# Patient Record
Sex: Female | Born: 1950 | Race: White | Hispanic: No | Marital: Single | Smoking: Former smoker
Health system: Southern US, Community
[De-identification: ages and names within clinical notes are randomized; demographics above are authoritative.]

## PROBLEM LIST (undated history)

## (undated) DIAGNOSIS — E785 Hyperlipidemia, unspecified: Secondary | ICD-10-CM

## (undated) DIAGNOSIS — I10 Essential (primary) hypertension: Secondary | ICD-10-CM

## (undated) HISTORY — PX: OTHER SURGICAL HISTORY: SHX169

## (undated) HISTORY — DX: Hyperlipidemia, unspecified: E78.5

## (undated) HISTORY — PX: APPENDECTOMY: SHX54

## (undated) HISTORY — PX: FINGER SURGERY: SHX640

## (undated) HISTORY — DX: Essential (primary) hypertension: I10

## (undated) HISTORY — PX: CHOLECYSTECTOMY: SHX55

---

## 2015-11-06 ENCOUNTER — Other Ambulatory Visit: Payer: Self-pay | Admitting: General Surgery

## 2015-11-06 DIAGNOSIS — R109 Unspecified abdominal pain: Secondary | ICD-10-CM

## 2015-11-12 ENCOUNTER — Ambulatory Visit
Admission: RE | Admit: 2015-11-12 | Discharge: 2015-11-12 | Disposition: A | Discharge status: Home / Self Care | Payer: Federal, State, Local not specified - PPO | Source: Ambulatory Visit | Attending: General Surgery | Admitting: General Surgery

## 2015-11-12 DIAGNOSIS — R109 Unspecified abdominal pain: Secondary | ICD-10-CM

## 2015-11-12 MED ORDER — IOPAMIDOL (ISOVUE-300) INJECTION 61%
100.0000 mL | Freq: Once | INTRAVENOUS | Status: AC | PRN
  Administered 2015-11-12: 100 mL via INTRAVENOUS

## 2017-06-16 IMAGING — CT CT ABD-PELV W/ CM
3 of 5 series · 12 of 36 positions shown, 18 images · IV contrast (READICAT/WATER & [ID] ISOVUE 300)
Comparison: None.

CLINICAL DATA: Right lower quadrant pain for 6 months.
Constipation. Previous appendectomy, cholecystectomy, tubal ligation
and C-section.

EXAM:
CT ABDOMEN AND PELVIS WITH CONTRAST
TECHNIQUE: Multidetector CT imaging of the abdomen and pelvis was performed
using the standard protocol following bolus administration of
intravenous contrast.
CONTRAST:  100mL ST2U4S-4CC IOPAMIDOL (ST2U4S-4CC) INJECTION 61%

[Series 3: abd/pelvis with · axial · 0.74mm/px · z∈[-303,-8]mm · 7 of 79 slices shown, 12 images]
[im 10/79  soft-tissue]
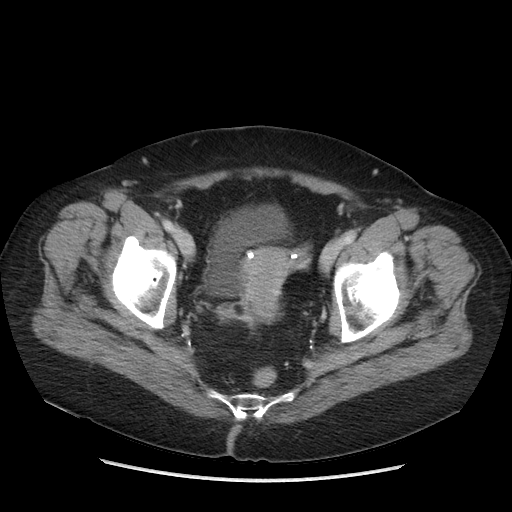
[im 10/79  bone]
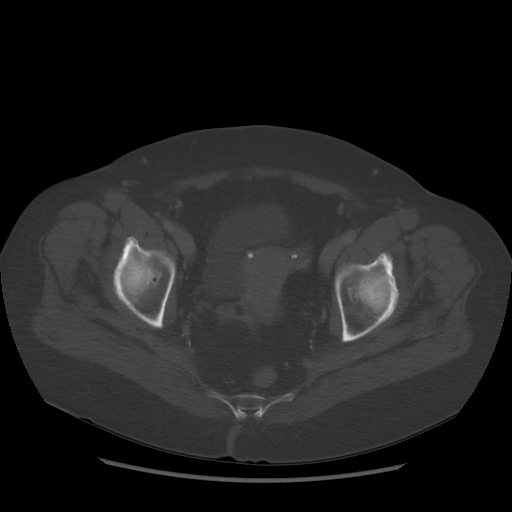
[im 20/79  soft-tissue]
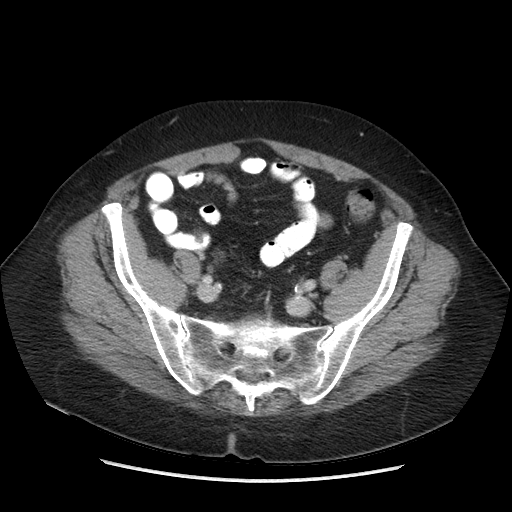
[im 30/79  soft-tissue]
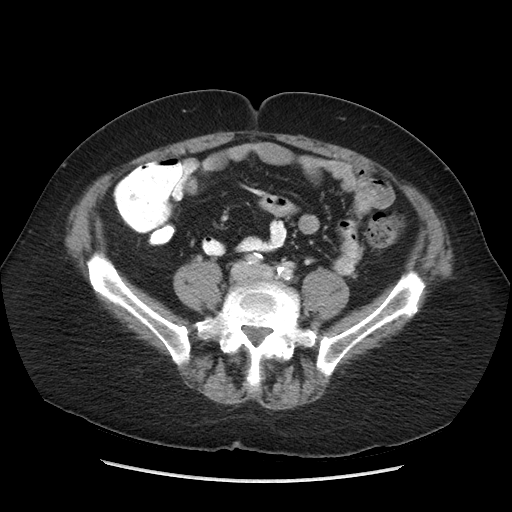
[im 40/79  soft-tissue]
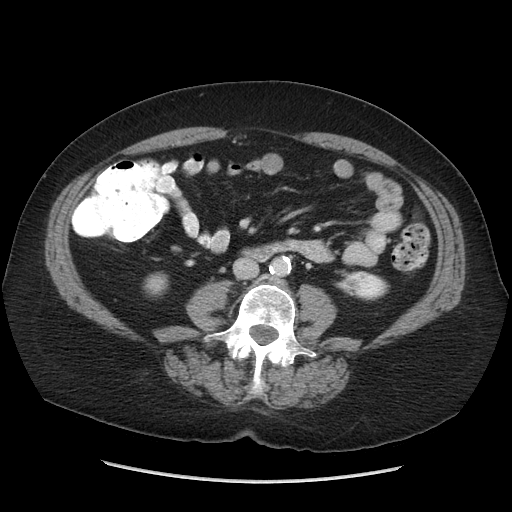
[im 40/79  lung]
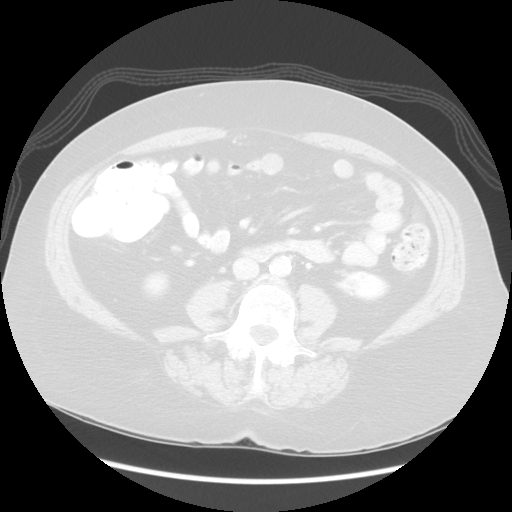
[im 49/79  soft-tissue]
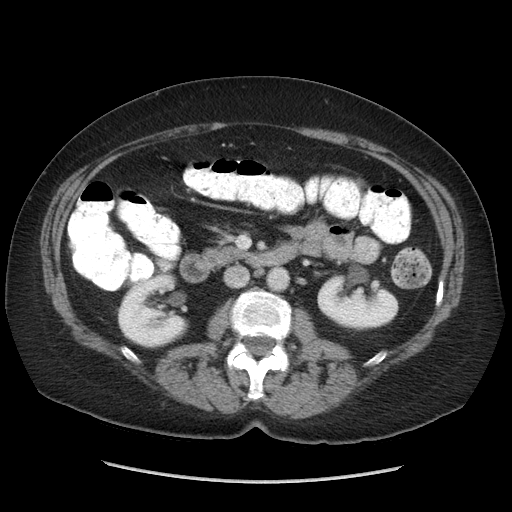
[im 49/79  lung]
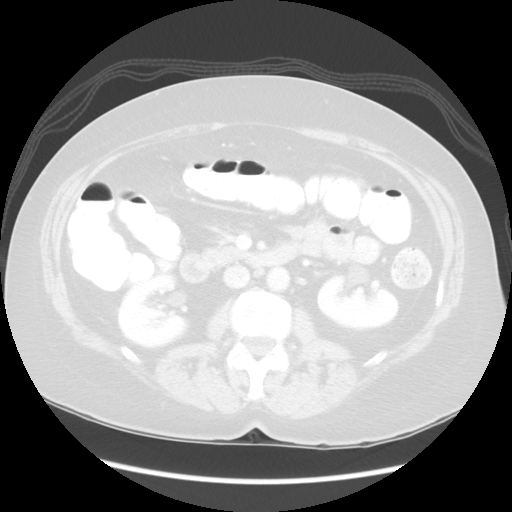
[im 59/79  soft-tissue]
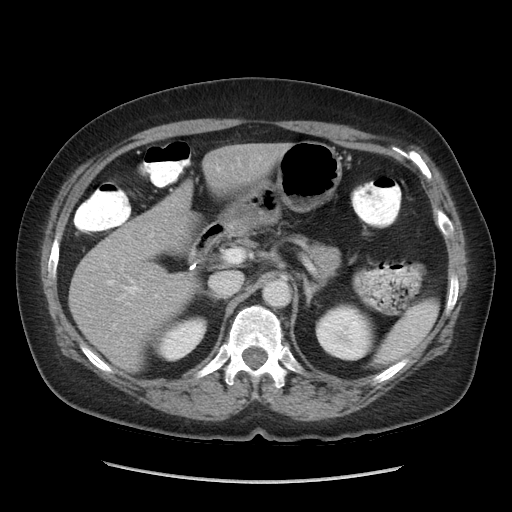
[im 59/79  lung]
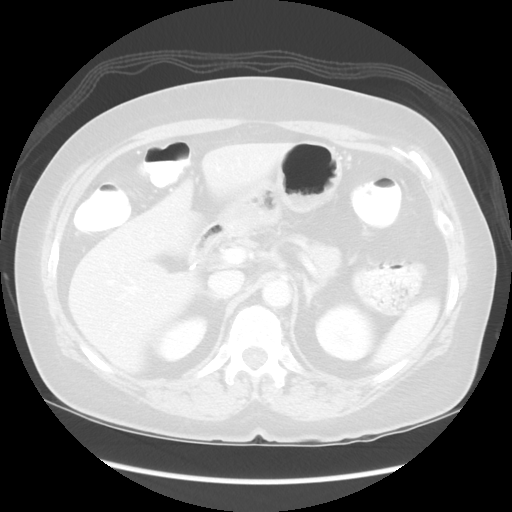
[im 69/79  soft-tissue]
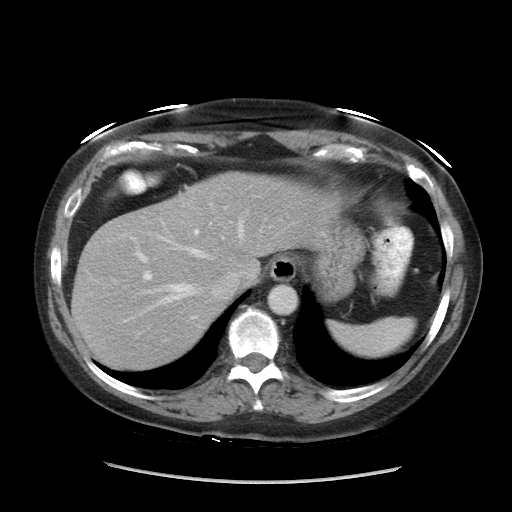
[im 69/79  lung]
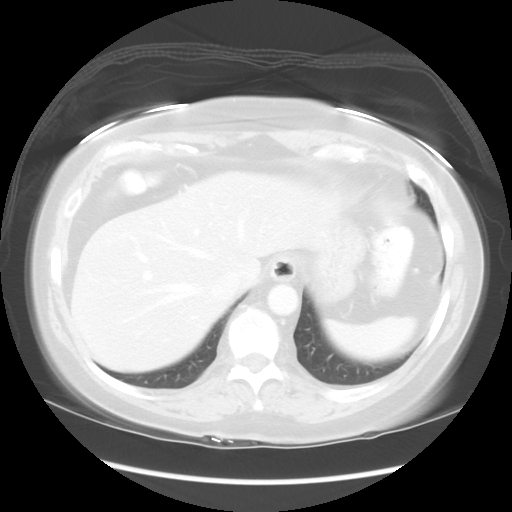

[Series 601: coronal body · coronal · 0.90mm/px · 1 of 122 slices shown, 2 images]
[im 41/122  soft-tissue]
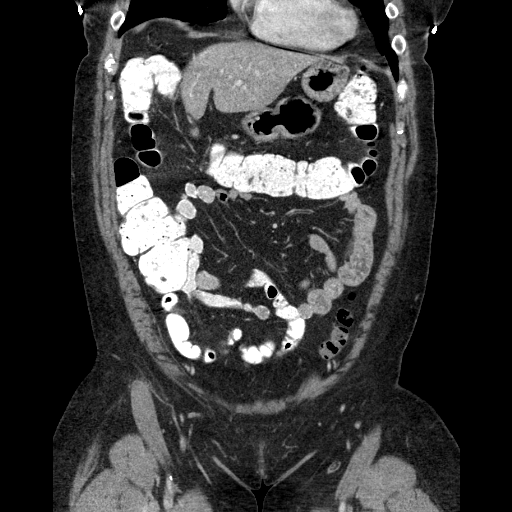
[im 41/122  bone]
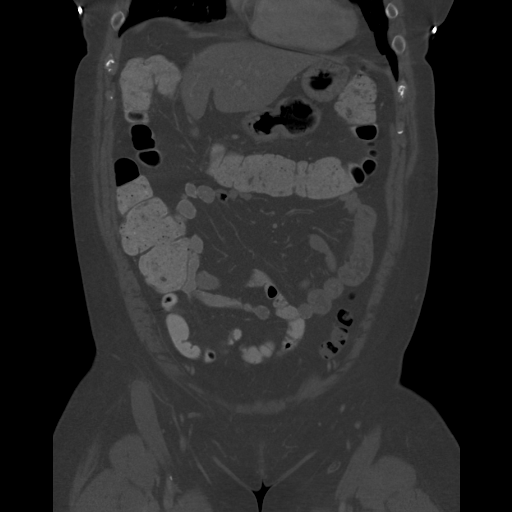

[Series 602: sagittal body · sagittal · 0.90mm/px · 4 of 153 slices shown]
[im 10/153  soft-tissue]
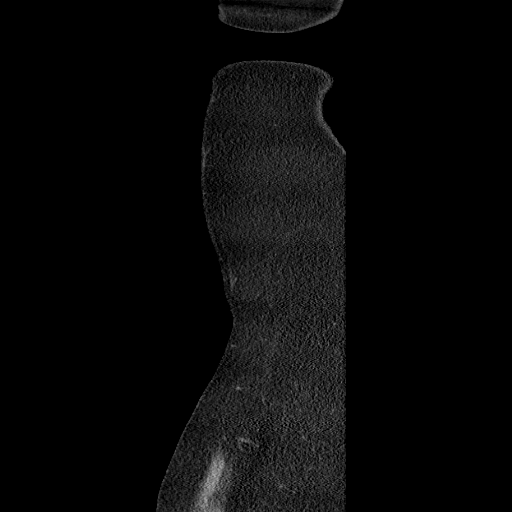
[im 29/153  soft-tissue]
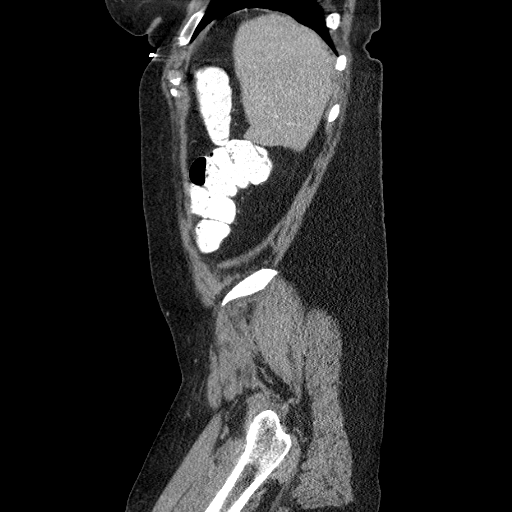
[im 48/153  soft-tissue]
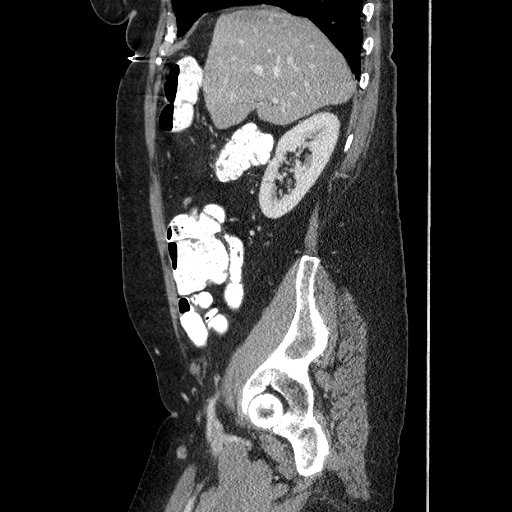
[im 67/153  soft-tissue]
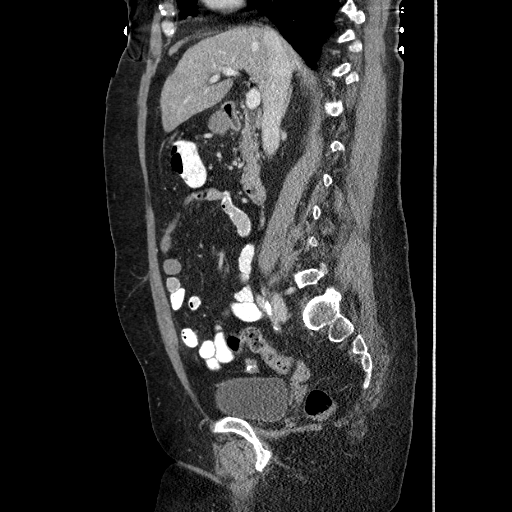

[12 of 36 positions shown; findings below may reference images not displayed]

FINDINGS: Lower chest:  No acute findings.

Hepatobiliary: Status post cholecystectomy. No bile duct dilatation.
Liver appears normal.

Pancreas: No mass, inflammatory changes, or other significant
abnormality.

Spleen: Within normal limits in size and appearance.

Adrenals/Urinary Tract: Adrenal glands appear normal. Kidneys are
unremarkable without stone or hydronephrosis. No ureteral or bladder
calculi identified. Bladder appears normal.

Stomach/Bowel: Bowel is normal in caliber. No bowel wall thickening
or evidence of bowel wall inflammation seen. Patient is status post
appendectomy.

Vascular/Lymphatic: Scattered atherosclerotic changes of the normal-
caliber abdominal aorta. No enlarged lymph nodes seen within the
abdomen or pelvis.

Reproductive: Tubal ligation clips in place, grossly
well-positioned. Adnexal regions otherwise unremarkable. No mass or
free fluid within either adnexal region. No mass or free fluid in
the cul-de-sac.

Other: No free fluid or abscess collection seen. No free
intraperitoneal air.

Musculoskeletal: Mild degenerative change in the thoracolumbar
spine. Combination of disc bulges and facet hypertrophy are present
at the L3-4 through L5-S1 levels, causing mild to moderate central
canal stenoses, with possible associated nerve root impingement.

No acute or suspicious osseous lesion. Superficial soft tissues are
unremarkable. Small periumbilical abdominal wall hernia noted which
contains fat only.
IMPRESSION: 1. No acute findings. No source for right lower quadrant pain
identified. No free fluid or inflammatory change. No bowel
obstruction or evidence of bowel wall inflammation. No evidence of
acute solid organ abnormality. No renal or ureteral calculi.
2. Degenerative changes of the thoracolumbar spine, as detailed
above. Suspect associated nerve root impingement at 1 or more
levels. If patient's symptoms could be radiculopathic in nature,
would consider lumbar spine MRI for further characterization.

## 2020-04-14 ENCOUNTER — Ambulatory Visit: Payer: Federal, State, Local not specified - PPO | Admitting: Registered Nurse

## 2020-04-14 ENCOUNTER — Other Ambulatory Visit: Payer: Self-pay

## 2020-04-14 ENCOUNTER — Encounter: Payer: Self-pay | Admitting: Registered Nurse

## 2020-04-14 VITALS — BP 137/79 | HR 96 | Temp 98.2°F | Resp 15 | Ht 64.0 in | Wt 175.8 lb

## 2020-04-14 DIAGNOSIS — Z1329 Encounter for screening for other suspected endocrine disorder: Secondary | ICD-10-CM

## 2020-04-14 DIAGNOSIS — Z13228 Encounter for screening for other metabolic disorders: Secondary | ICD-10-CM

## 2020-04-14 DIAGNOSIS — Z7689 Persons encountering health services in other specified circumstances: Secondary | ICD-10-CM | POA: Diagnosis not present

## 2020-04-14 DIAGNOSIS — Z13220 Encounter for screening for lipoid disorders: Secondary | ICD-10-CM

## 2020-04-14 DIAGNOSIS — Z130 Encounter for screening for diseases of the blood and blood-forming organs and certain disorders involving the immune mechanism: Secondary | ICD-10-CM

## 2020-04-14 NOTE — Patient Instructions (Signed)
° ° ° °  If you have lab work done today you will be contacted with your lab results within the next 2 weeks.  If you have not heard from us then please contact us. The fastest way to get your results is to register for My Chart. ° ° °IF you received an x-ray today, you will receive an invoice from [facility]. Please contact  Radiology at [phone] with questions or concerns regarding your invoice.  ° °IF you received labwork today, you will receive an invoice from [facility]. Please contact [facility] at [phone] with questions or concerns regarding your invoice.  ° °Our billing staff will not be able to assist you with questions regarding bills from these companies. ° °You will be contacted with the lab results as soon as they are available. The fastest way to get your results is to activate your My Chart account. Instructions are located on the last page of this paperwork. If you have not heard from us regarding the results in 2 weeks, please contact this office. °  ° ° ° °

## 2020-04-15 LAB — COMPREHENSIVE METABOLIC PANEL
ALT: 18 IU/L (ref 0–32)
AST: 21 IU/L (ref 0–40)
Albumin/Globulin Ratio: 1.9 (ref 1.2–2.2)
Albumin: 3.8 g/dL (ref 3.8–4.8)
Alkaline Phosphatase: 66 IU/L (ref 44–121)
BUN/Creatinine Ratio: 22 (ref 12–28)
BUN: 14 mg/dL (ref 8–27)
Bilirubin Total: 0.3 mg/dL (ref 0.0–1.2)
CO2: 25 mmol/L (ref 20–29)
Calcium: 9.5 mg/dL (ref 8.7–10.3)
Chloride: 102 mmol/L (ref 96–106)
Creatinine, Ser: 0.63 mg/dL (ref 0.57–1.00)
GFR calc Af Amer: 107 mL/min/{1.73_m2} (ref >59)
GFR calc non Af Amer: 93 mL/min/{1.73_m2} (ref >59)
Globulin, Total: 2 g/dL (ref 1.5–4.5)
Glucose: 104 mg/dL — ABNORMAL HIGH (ref 65–99)
Potassium: 5.1 mmol/L (ref 3.5–5.2)
Sodium: 138 mmol/L (ref 134–144)
Total Protein: 5.8 g/dL — ABNORMAL LOW (ref 6.0–8.5)

## 2020-04-15 LAB — LIPID PANEL
Chol/HDL Ratio: 3.1 ratio (ref 0.0–4.4)
Cholesterol, Total: 279 mg/dL — ABNORMAL HIGH (ref 100–199)
HDL: 91 mg/dL (ref >39)
LDL Chol Calc (NIH): 156 mg/dL — ABNORMAL HIGH (ref 0–99)
Triglycerides: 182 mg/dL — ABNORMAL HIGH (ref 0–149)
VLDL Cholesterol Cal: 32 mg/dL (ref 5–40)

## 2020-04-15 LAB — CBC WITH DIFFERENTIAL
Basophils Absolute: 0 10*3/uL (ref 0.0–0.2)
Basos: 1 %
EOS (ABSOLUTE): 0.1 10*3/uL (ref 0.0–0.4)
Eos: 2 %
Hematocrit: 42.1 % (ref 34.0–46.6)
Hemoglobin: 14.6 g/dL (ref 11.1–15.9)
Immature Grans (Abs): 0 10*3/uL (ref 0.0–0.1)
Immature Granulocytes: 0 %
Lymphocytes Absolute: 1.6 10*3/uL (ref 0.7–3.1)
Lymphs: 29 %
MCH: 32.7 pg (ref 26.6–33.0)
MCHC: 34.7 g/dL (ref 31.5–35.7)
MCV: 94 fL (ref 79–97)
Monocytes Absolute: 0.7 10*3/uL (ref 0.1–0.9)
Monocytes: 12 %
Neutrophils Absolute: 3.1 10*3/uL (ref 1.4–7.0)
Neutrophils: 56 %
RBC: 4.46 x10E6/uL (ref 3.77–5.28)
RDW: 11.8 % (ref 11.7–15.4)
WBC: 5.5 10*3/uL (ref 3.4–10.8)

## 2020-04-15 LAB — HEMOGLOBIN A1C
Est. average glucose Bld gHb Est-mCnc: 117 mg/dL
Hgb A1c MFr Bld: 5.7 % — ABNORMAL HIGH (ref 4.8–5.6)

## 2020-04-15 LAB — TSH: TSH: 5.2 u[IU]/mL — ABNORMAL HIGH (ref 0.450–4.500)

## 2020-04-15 NOTE — Progress Notes (Signed)
If we could call Angela Church -   Her labs are back. Her cholesterol continues to be elevated despite her use of 10mg  rosuvastatin daily. She needs to buckle down on her diet and exercise in order to best control this. Her risk for an adverse cardiovascular event at this time is higher than I'd like it to be.  Additionally, her thyroid is running a little bit slower than normal. I'd like to recheck this in about 6 months (as well as the lipids) to make sure there's no further changes.  This can be a lab only visit or in office visit  Thanks  Kathrin Ruddy, NP

## 2020-05-29 ENCOUNTER — Telehealth: Payer: Self-pay | Admitting: Registered Nurse

## 2020-05-29 MED ORDER — AMLODIPINE BESYLATE 5 MG PO TABS
5.0000 mg | ORAL_TABLET | Freq: Every day | ORAL | 1 refills | Status: DC
Start: 2020-05-29 — End: 2022-07-27

## 2020-05-29 MED ORDER — LISINOPRIL 10 MG PO TABS: 10.0000 mg | ORAL_TABLET | Freq: Every day | ORAL | 1 refills | Status: AC

## 2020-05-29 MED ORDER — ROSUVASTATIN CALCIUM 10 MG PO TABS: 10.0000 mg | ORAL_TABLET | Freq: Every day | ORAL | 1 refills | Status: AC

## 2020-05-29 NOTE — Telephone Encounter (Signed)
Fraser Din had a new patient appr with Orland Mustard in October  And is needing refills  Patient will be out in week  What is the name of the medication? amLODipine (NORVASC) 5 MG tablet  lisinopril (ZESTRIL) 10 MG tablet [[id number] rosuvastatin (CRESTOR) 10 MG tablet    Have you contacted your pharmacy to request a refill? y  Which pharmacy would you like this sent to? [facility]    Patient notified that their request is being sent to the clinical staff for review and that they should receive a call once it is complete. If they do not receive a call within 72 hours they can check with their pharmacy or our office.   Please advise

## 2020-07-03 ENCOUNTER — Encounter: Payer: Self-pay | Admitting: Registered Nurse

## 2020-07-03 NOTE — Progress Notes (Signed)
New Patient Office Visit  Subjective:  Patient ID: Angela Church, female    DOB: 11-Aug-1950  Age: 70 y.o. MRN: [id number]  CC:  Chief Complaint  Patient presents with  . Establish Care    pt here today to establish care as a new patient     HPI Angela Church presents for visit to est carem  Hx of htn and hld  Htn: taking amlodipine 5mg  PO qd and lisinopril 10mg  PO qd. Good effect. No AEs. BP wnl today  HLD: taking rosuvastatin 10mg  PO qd. Will check lipids today  Histories reviewed with patient, updated as warranted Health maintenance reviewed as well    Past Medical History:  Diagnosis Date  . Hyperlipidemia   . Hypertension     Past Surgical History:  Procedure Laterality Date  . APPENDECTOMY    . CESAREAN SECTION    . CHOLECYSTECTOMY      Family History  Problem Relation Age of Onset  . Cancer Mother   . Hyperlipidemia Mother   . Hypertension Mother   . Hyperlipidemia Father   . Hypertension Father   . Dementia Sister 34    Social History   Socioeconomic History  . Marital status: Single    Spouse name: Not on file  . Number of children: Not on file  . Years of education: Not on file  . Highest education level: Not on file  Occupational History  . Not on file  Tobacco Use  . Smoking status: Former Research scientist (life sciences)  . Smokeless tobacco: Never Used  . Tobacco comment: apx quit year 2010  Vaping Use  . Vaping Use: Never used  Substance and Sexual Activity  . Alcohol use: Yes    Comment: 5 drinks per week, beer or vodka and water  . Drug use: Never  . Sexual activity: Not Currently  Other Topics Concern  . Not on file  Social History Narrative  . Not on file   Social Determinants of Health   Financial Resource Strain: Not on file  Food Insecurity: Not on file  Transportation Needs: Not on file  Physical Activity: Not on file  Stress: Not on file  Social Connections: Not on file  Intimate Partner Violence: Not on file    ROS Review of Systems   Constitutional: Negative.   HENT: Negative.   Eyes: Negative.   Respiratory: Negative.   Cardiovascular: Negative.   Gastrointestinal: Negative.   Genitourinary: Negative.   Musculoskeletal: Negative.   Skin: Negative.   Neurological: Negative.   Psychiatric/Behavioral: Negative.   All other systems reviewed and are negative.   Objective:   Today's Vitals: BP 137/79   Pulse 96   Temp 98.2 F (36.8 C) (Temporal)   Resp 15   Ht 5\' 4"  (1.626 m)   Wt 175 lb 12.8 oz (79.7 kg)   SpO2 98%   BMI 30.18 kg/m   Physical Exam Vitals and nursing note reviewed.  Constitutional:      General: She is not in acute distress.    Appearance: Normal appearance. She is not ill-appearing, toxic-appearing or diaphoretic.  Cardiovascular:     Rate and Rhythm: Normal rate and regular rhythm.     Pulses: Normal pulses.     Heart sounds: Normal heart sounds. No murmur heard. No friction rub. No gallop.   Pulmonary:     Effort: Pulmonary effort is normal. No respiratory distress.     Breath sounds: Normal breath sounds. No stridor. No wheezing, rhonchi or rales.  Chest:     Chest wall: No tenderness.  Skin:    General: Skin is warm and dry.     Capillary Refill: Capillary refill takes less than 2 seconds.  Neurological:     General: No focal deficit present.     Mental Status: She is alert and oriented to person, place, and time. Mental status is at baseline.  Psychiatric:        Mood and Affect: Mood normal.        Behavior: Behavior normal.        Thought Content: Thought content normal.        Judgment: Judgment normal.     Assessment & Plan:   Problem List Items Addressed This Visit   None   Visit Diagnoses    Screening for endocrine, metabolic and immunity disorder    -  Primary   Relevant Orders   CBC With Differential (Completed)   Comprehensive metabolic panel (Completed)   TSH (Completed)   Hemoglobin A1c (Completed)   Lipid screening       Relevant Orders   Lipid  panel (Completed)   Encounter to establish care          Outpatient Encounter Medications as of 04/14/2020  Medication Sig  . [DISCONTINUED] amLODipine (NORVASC) 5 MG tablet Take 5 mg by mouth daily.  . [DISCONTINUED] lisinopril (ZESTRIL) 10 MG tablet Take 10 mg by mouth daily.  . [DISCONTINUED] rosuvastatin (CRESTOR) 10 MG tablet Take 10 mg by mouth daily.   No facility-administered encounter medications on file as of 04/14/2020.    Follow-up: No follow-ups on file.   PLAN  Refill meds  Updated history  Return in 6 mo for follow up on chronic conditions, sooner if labs indicate  Patient encouraged to call clinic with any questions, comments, or concerns.  Maximiano Coss, NP

## 2020-08-22 ENCOUNTER — Emergency Department (HOSPITAL_COMMUNITY)
Admission: EM | Admit: 2020-08-22 | Discharge: 2020-08-22 | Disposition: A | Discharge status: Home / Self Care | Payer: Medicare Other | Attending: Emergency Medicine | Admitting: Emergency Medicine

## 2020-08-22 ENCOUNTER — Encounter (HOSPITAL_COMMUNITY): Payer: Self-pay

## 2020-08-22 ENCOUNTER — Other Ambulatory Visit: Payer: Self-pay

## 2020-08-22 DIAGNOSIS — I10 Essential (primary) hypertension: Secondary | ICD-10-CM | POA: Diagnosis not present

## 2020-08-22 DIAGNOSIS — M546 Pain in thoracic spine: Secondary | ICD-10-CM | POA: Insufficient documentation

## 2020-08-22 DIAGNOSIS — Z87891 Personal history of nicotine dependence: Secondary | ICD-10-CM | POA: Diagnosis not present

## 2020-08-22 DIAGNOSIS — Z79899 Other long term (current) drug therapy: Secondary | ICD-10-CM | POA: Insufficient documentation

## 2020-08-22 MED ORDER — CYCLOBENZAPRINE HCL 10 MG PO TABS: 10.0000 mg | ORAL_TABLET | Freq: Two times a day (BID) | ORAL | 0 refills | Status: DC | PRN

## 2020-08-22 NOTE — ED Notes (Signed)
Patient prefers to sit in wheelchair due to pain in back

## 2020-08-22 NOTE — Discharge Instructions (Signed)
You have been seen here for back pain.  Given you a prescription for muscle relaxers please take as prescribed.  Please beware this medication can make you drowsy I do not recommend taking this with alcohol or operate heavy machinery's.  I recommend taking over-the-counter pain medications like ibuprofen and/or Tylenol every 6 as needed.  Please follow dosage and on the back of bottle.  I also recommend applying heat to the area and stretching out the muscles as this will help decrease stiffness and pain.  I have given you information on exercises please follow.  You may follow-up with your primary care doctor in 2 weeks time if symptoms not fully resolved. Come back to the emergency department if you develop chest pain, shortness of breath, severe abdominal pain, uncontrolled nausea, vomiting, diarrhea.

## 2020-08-22 NOTE — ED Provider Notes (Signed)
I provided a substantive portion of the care of this patient.  I personally performed the entirety of the medical decision making for this encounter.    Patient complaining of lower back pain.  No red flags.  Will discharge   Lacretia Leigh, MD 08/22/20 1136

## 2020-08-22 NOTE — ED Notes (Signed)
ED Provider at bedside. 

## 2020-08-22 NOTE — ED Provider Notes (Signed)
[facility] DEPT Provider Note   CSN: [id number] Arrival date & time: 08/22/20  1006     History Chief Complaint  Patient presents with  . Back Pain    Angela Church is a 70 y.o. female.  HPI   Patient with no significant medical history presents with chief complaint of left upper back pain.  Patient endorses pain started on Monday and has stayed about the same.  She describes it as a sharp sensation which she feels in her left upper back, does not radiate into her arms or down to her legs, not associated with paresthesias or weakness in the upper or lower extremities.  She denies urinary retention, incontinency, difficulty with bowel movements.  She denies history of IV drug use, denies autoimmune diseases.  She endorses that she has been raking leaves and starting out a new fitness program and thinks it is from this.  She denies urinary symptoms, no history of kidney stones, denies abdominal pain.  Patient denies alleviating factors.  Patient denies headaches, fevers, chills, shortness breath, chest pain, abdominal pain, nausea, vomiting, diarrhea, worsening pedal edema.  Past Medical History:  Diagnosis Date  . Hyperlipidemia   . Hypertension     There are no problems to display for this patient.   Past Surgical History:  Procedure Laterality Date  . APPENDECTOMY    . CESAREAN SECTION    . CHOLECYSTECTOMY    . FINGER SURGERY       OB History   No obstetric history on file.     Family History  Problem Relation Age of Onset  . Cancer Mother   . Hyperlipidemia Mother   . Hypertension Mother   . Hyperlipidemia Father   . Hypertension Father   . Dementia Sister 15    Social History   Tobacco Use  . Smoking status: Former Research scientist (life sciences)  . Smokeless tobacco: Never Used  . Tobacco comment: apx quit year 2010  Vaping Use  . Vaping Use: Never used  Substance Use Topics  . Alcohol use: Yes  . Drug use: Never    Home Medications Prior to  Admission medications   Medication Sig Start Date End Date Taking? Authorizing Provider  cyclobenzaprine (FLEXERIL) 10 MG tablet Take 1 tablet (10 mg total) by mouth 2 (two) times daily as needed for muscle spasms. 08/22/20  Yes Marcello Fennel, PA-C  amLODipine (NORVASC) 5 MG tablet Take 1 tablet (5 mg total) by mouth daily. 05/29/20   Maximiano Coss, NP  lisinopril (ZESTRIL) 10 MG tablet Take 1 tablet (10 mg total) by mouth daily. 05/29/20   Maximiano Coss, NP  rosuvastatin (CRESTOR) 10 MG tablet Take 1 tablet (10 mg total) by mouth daily. 05/29/20   Maximiano Coss, NP    Allergies    Patient has no known allergies.  Review of Systems   Review of Systems  Constitutional: Negative for chills and fever.  HENT: Negative for congestion and sore throat.   Respiratory: Negative for shortness of breath.   Cardiovascular: Negative for chest pain.  Gastrointestinal: Negative for abdominal pain.  Genitourinary: Negative for enuresis.  Musculoskeletal: Positive for back pain.  Skin: Negative for rash.  Neurological: Negative for headaches.  Hematological: Does not bruise/bleed easily.    Physical Exam Updated Vital Signs BP 123/84   Pulse 87   Temp 98.5 F (36.9 C) (Oral)   Resp 15   Ht 5\' 4"  (1.626 m)   Wt 72.1 kg   SpO2 96%  BMI 27.29 kg/m   Physical Exam Vitals and nursing note reviewed.  Constitutional:      General: She is not in acute distress.    Appearance: She is not ill-appearing.  HENT:     Head: Normocephalic and atraumatic.     Nose: No congestion.  Eyes:     Conjunctiva/sclera: Conjunctivae normal.  Cardiovascular:     Rate and Rhythm: Normal rate and regular rhythm.  Pulmonary:     Effort: Pulmonary effort is normal.  Abdominal:     Palpations: Abdomen is soft.     Tenderness: There is no abdominal tenderness. There is no right CVA tenderness or left CVA tenderness.  Musculoskeletal:     Right lower leg: No edema.     Left lower leg: No edema.      Comments: Patient spine was palpated, nontender to palpation, no step-off or deformities present.  Patient noted tenderness along her left scapula.  She had full range of motion in her upper lower extremities.   Skin:    General: Skin is warm and dry.  Neurological:     Mental Status: She is alert.  Psychiatric:        Mood and Affect: Mood normal.     ED Results / Procedures / Treatments   Labs (all labs ordered are listed, but only abnormal results are displayed) Labs Reviewed - No data to display  EKG None  Radiology No results found.  Procedures Procedures   Medications Ordered in ED Medications - No data to display  ED Course  I have reviewed the triage vital signs and the nursing notes.  Pertinent labs & imaging results that were available during my care of the patient were reviewed by me and considered in my medical decision making (see chart for details).    MDM Rules/Calculators/A&P                         Initial impression-patient presents with left-sided back pain.  She is alert, does not appear in acute distress, vital signs reassuring.  Work-up-due to well-appearing patient, benign physical exam, further lab or imaging not warranted at this time.  Rule out- I have low suspicion for spinal fracture or spinal cord abnormality as patient denies urinary incontinency, retention, difficulty with bowel movements, denies saddle paresthesias.  Spine was palpated there is no step-off, crepitus or gross deformities felt, patient full range of motion, neurovascular fully intact in the upper and lower extremities.  Will defer imaging as there is no traumatic injury.  Low suspicion for UTI, pyelonephritis, kidney stones as patient denies urinary symptoms, no CVA tenderness noted on my exam.   Plan-suspect patient suffering from a muscular strain.  Will provide patient with muscle relaxers exercises and have her follow-up with her PCP in 1 to 2 weeks if symptoms have not  resolved.  Vital signs have remained stable, no indication for hospital admission.  Patient discussed with attending and they agreed with assessment and plan.  Patient given at home care as well strict return precautions.  Patient verbalized that they understood agreed to said plan.   Final Clinical Impression(s) / ED Diagnoses Final diagnoses:  Acute left-sided thoracic back pain    Rx / DC Orders ED Discharge Orders         Ordered    cyclobenzaprine (FLEXERIL) 10 MG tablet  2 times daily PRN        08/22/20 1105  Marcello Fennel, PA-C 08/22/20 1131    Lacretia Leigh, MD 08/26/20 1017

## 2020-08-22 NOTE — ED Triage Notes (Signed)
Patient c/o left  Mid back pain that started earlier in the week after raking leaves.

## 2020-09-28 ENCOUNTER — Ambulatory Visit (INDEPENDENT_AMBULATORY_CARE_PROVIDER_SITE_OTHER): Payer: Medicare Other | Admitting: Podiatry

## 2020-09-28 ENCOUNTER — Other Ambulatory Visit: Payer: Self-pay

## 2020-09-28 ENCOUNTER — Ambulatory Visit (INDEPENDENT_AMBULATORY_CARE_PROVIDER_SITE_OTHER): Payer: Medicare Other

## 2020-09-28 DIAGNOSIS — D481 Neoplasm of uncertain behavior of connective and other soft tissue: Secondary | ICD-10-CM

## 2020-09-28 DIAGNOSIS — D4819 Other specified neoplasm of uncertain behavior of connective and other soft tissue: Secondary | ICD-10-CM

## 2020-09-28 DIAGNOSIS — S9030XA Contusion of unspecified foot, initial encounter: Secondary | ICD-10-CM

## 2020-09-28 DIAGNOSIS — M7989 Other specified soft tissue disorders: Secondary | ICD-10-CM | POA: Diagnosis not present

## 2020-09-28 NOTE — Progress Notes (Signed)
   HPI: 70 y.o. female presenting today as a new patient for evaluation of prominent areas that are very symptomatic to the bilateral feet.  Patient states that she also has them on her hands.  They are very symptomatic and painful and certain shoe gear where they rub.  She has a lesion to the outside of the bilateral fifth MTPJ's as well as the medial aspect of the left great toe and plantar aspect of the right second toe.  She denies history of injury.  She states that they gradually developed.  They are currently very symptomatic and she presents for further treatment and evaluation  Past Medical History:  Diagnosis Date  . Hyperlipidemia   . Hypertension      Physical Exam: General: The patient is alert and oriented x3 in no acute distress.  Dermatology: Skin is warm, dry and supple bilateral lower extremities. Negative for open lesions or macerations.  Vascular: Palpable pedal pulses bilaterally. No edema or erythema noted. Capillary refill within normal limits.  Neurological: Epicritic and protective threshold grossly intact bilaterally.   Musculoskeletal Exam: Range of motion within normal limits to all pedal and ankle joints bilateral. Muscle strength 5/5 in all groups bilateral.  Not adhered, semimalleable masses noted to the fifth MTPJ's bilateral and plantar aspect of the right second foot as well as the medial aspect of the left great toe with associated tenderness to palpation  Radiographic Exam:  Normal osseous mineralization. Joint spaces preserved. No fracture/dislocation/boney destruction.  Enlargement of the soft tissue around these lesions mentioned in the musculoskeletal exam noted  Assessment: 1.  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  Subsecond MTPJ right.  Medial aspect left hallux.   Plan of Care:  1. Patient evaluated. X-Rays reviewed.  2.  To better visualize the soft tissue tumors, we are going to order an  MRI bilateral feet 3.  I did explain to the patient that likely we will need to have them surgically excised pending MRI results 4.  Return to clinic after MRI to discuss results and further treatment options      Edrick Kins, DPM [facility]  Dr. Edrick Kins, DPM    [address], [place] [address]                Office [phone]  Fax [phone]

## 2020-10-14 ENCOUNTER — Ambulatory Visit
Admission: RE | Admit: 2020-10-14 | Discharge: 2020-10-14 | Disposition: A | Discharge status: Home / Self Care | Payer: Medicare Other | Source: Ambulatory Visit | Attending: Podiatry | Admitting: Podiatry

## 2020-10-14 ENCOUNTER — Ambulatory Visit: Payer: Federal, State, Local not specified - PPO

## 2020-10-14 DIAGNOSIS — M7989 Other specified soft tissue disorders: Secondary | ICD-10-CM

## 2020-10-14 DIAGNOSIS — D481 Neoplasm of uncertain behavior of connective and other soft tissue: Secondary | ICD-10-CM

## 2020-10-28 ENCOUNTER — Other Ambulatory Visit: Payer: Self-pay

## 2020-10-28 ENCOUNTER — Ambulatory Visit (INDEPENDENT_AMBULATORY_CARE_PROVIDER_SITE_OTHER): Payer: Medicare Other | Admitting: Podiatry

## 2020-10-28 DIAGNOSIS — M7989 Other specified soft tissue disorders: Secondary | ICD-10-CM | POA: Diagnosis not present

## 2020-10-28 DIAGNOSIS — D481 Neoplasm of uncertain behavior of connective and other soft tissue: Secondary | ICD-10-CM | POA: Diagnosis not present

## 2020-11-16 NOTE — Progress Notes (Signed)
   HPI: 70 y.o. female presenting today for follow-up evaluation of soft tissue masses to the feet bilateral.  Patient states that she also has them on her hands.  They are very symptomatic and painful and certain shoe gear where they rub.  When she wears appropriate shoes that do not rub these lesions there asymptomatic for the most part.  She has a lesion to the outside of the bilateral fifth MTPJ's as well as the medial aspect of the left great toe and plantar aspect of the right second toe.  She denies history of injury.  She states that they gradually developed.  Last visit MRIs were ordered.  They are currently very symptomatic and she presents to review MRIs and discuss further treatment options  Past Medical History:  Diagnosis Date  . Hyperlipidemia   . Hypertension      Physical Exam: General: The patient is alert and oriented x3 in no acute distress.  Dermatology: Skin is warm, dry and supple bilateral lower extremities. Negative for open lesions or macerations.  Vascular: Palpable pedal pulses bilaterally. No edema or erythema noted. Capillary refill within normal limits.  Neurological: Epicritic and protective threshold grossly intact bilaterally.   Musculoskeletal Exam: Range of motion within normal limits to all pedal and ankle joints bilateral. Muscle strength 5/5 in all groups bilateral.  Not adhered, semimalleable masses noted to the fifth MTPJ's bilateral and plantar aspect of the right second foot as well as the medial aspect of the left great toe with associated minimal tenderness to palpation  MRI LT foot w/o contrast 10/14/2020: Multiple T1 iso-hypointense, T2 hypointense masses along the plantar forefoot, along the medial distal plantar fascia and plantar aspect of the great toe and fifth MTP joint, the latter of which demonstrates internal edema signal. 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 This appears to be an extra articular process, therefore gouty tophus is felt to be less likely.  MRI RT foot w/o contrast 10/14/2020: Multiple T1 iso-hypointense, T2 hypointense masses along the plantar forefoot at the second and fifth MTP joints, with signal characteristics most compatible with plantar fibromas/fibromatosis. Given proximity to the flexor tendons, giant cell tumor of the tendon sheath would be another consideration in the differential. This appears to be an extra-articular process, therefore gouty tophus is felt to be less likely.  Assessment: 1.  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  Subsecond MTPJ right.  Medial aspect left hallux.   Plan of Care:  1. Patient evaluated.  MRIs reviewed.  2.  Currently the soft tissue masses and lesions are asymptomatic.  After reviewing the MRI we will simply observe for now. 3.  Recommend good supportive shoes that do not aggravate or irritate these lesions 4.  Return to clinic in 6 months     Edrick Kins, DPM [facility]  Dr. Edrick Kins, DPM    [address], [place] [address]                Office [phone]  Fax [phone]

## 2020-11-22 ENCOUNTER — Other Ambulatory Visit: Payer: Self-pay | Admitting: Registered Nurse

## 2022-07-26 ENCOUNTER — Emergency Department (HOSPITAL_COMMUNITY): Payer: Medicare Other

## 2022-07-26 ENCOUNTER — Other Ambulatory Visit: Payer: Self-pay

## 2022-07-26 ENCOUNTER — Encounter (HOSPITAL_COMMUNITY): Payer: Self-pay

## 2022-07-26 ENCOUNTER — Inpatient Hospital Stay (HOSPITAL_COMMUNITY)
Admission: EM | Admit: 2022-07-26 | Discharge: 2022-07-30 | DRG: 516 | Disposition: A | Discharge status: Home / Self Care | Payer: Medicare Other | Attending: Internal Medicine | Admitting: Internal Medicine

## 2022-07-26 DIAGNOSIS — E7800 Pure hypercholesterolemia, unspecified: Secondary | ICD-10-CM | POA: Diagnosis present

## 2022-07-26 DIAGNOSIS — M316 Other giant cell arteritis: Principal | ICD-10-CM | POA: Insufficient documentation

## 2022-07-26 DIAGNOSIS — E039 Hypothyroidism, unspecified: Secondary | ICD-10-CM | POA: Diagnosis present

## 2022-07-26 DIAGNOSIS — I10 Essential (primary) hypertension: Secondary | ICD-10-CM | POA: Diagnosis present

## 2022-07-26 DIAGNOSIS — H539 Unspecified visual disturbance: Secondary | ICD-10-CM | POA: Diagnosis not present

## 2022-07-26 DIAGNOSIS — Z885 Allergy status to narcotic agent status: Secondary | ICD-10-CM

## 2022-07-26 DIAGNOSIS — Z8249 Family history of ischemic heart disease and other diseases of the circulatory system: Secondary | ICD-10-CM

## 2022-07-26 DIAGNOSIS — Z83438 Family history of other disorder of lipoprotein metabolism and other lipidemia: Secondary | ICD-10-CM

## 2022-07-26 DIAGNOSIS — H547 Unspecified visual loss: Secondary | ICD-10-CM | POA: Diagnosis not present

## 2022-07-26 DIAGNOSIS — Z87891 Personal history of nicotine dependence: Secondary | ICD-10-CM

## 2022-07-26 DIAGNOSIS — H4710 Unspecified papilledema: Secondary | ICD-10-CM | POA: Diagnosis present

## 2022-07-26 DIAGNOSIS — Z79890 Hormone replacement therapy (postmenopausal): Secondary | ICD-10-CM

## 2022-07-26 DIAGNOSIS — Z9049 Acquired absence of other specified parts of digestive tract: Secondary | ICD-10-CM

## 2022-07-26 DIAGNOSIS — E785 Hyperlipidemia, unspecified: Secondary | ICD-10-CM | POA: Diagnosis present

## 2022-07-26 DIAGNOSIS — H543 Unqualified visual loss, both eyes: Secondary | ICD-10-CM | POA: Diagnosis present

## 2022-07-26 DIAGNOSIS — Z79899 Other long term (current) drug therapy: Secondary | ICD-10-CM

## 2022-07-26 DIAGNOSIS — M47816 Spondylosis without myelopathy or radiculopathy, lumbar region: Secondary | ICD-10-CM | POA: Diagnosis present

## 2022-07-26 LAB — CBC WITH DIFFERENTIAL/PLATELET
Abs Immature Granulocytes: 0.01 10*3/uL (ref 0.00–0.07)
Basophils Absolute: 0 10*3/uL (ref 0.0–0.1)
Basophils Relative: 1 %
Eosinophils Absolute: 0.1 10*3/uL (ref 0.0–0.5)
Eosinophils Relative: 1 %
HCT: 35.5 % — ABNORMAL LOW (ref 36.0–46.0)
Hemoglobin: 12 g/dL (ref 12.0–15.0)
Immature Granulocytes: 0 %
Lymphocytes Relative: 27 %
Lymphs Abs: 1.5 10*3/uL (ref 0.7–4.0)
MCH: 31.6 pg (ref 26.0–34.0)
MCHC: 33.8 g/dL (ref 30.0–36.0)
MCV: 93.4 fL (ref 80.0–100.0)
Monocytes Absolute: 0.9 10*3/uL (ref 0.1–1.0)
Monocytes Relative: 16 %
Neutro Abs: 3.1 10*3/uL (ref 1.7–7.7)
Neutrophils Relative %: 55 %
Platelets: 297 10*3/uL (ref 150–400)
RBC: 3.8 MIL/uL — ABNORMAL LOW (ref 3.87–5.11)
RDW: 12.6 % (ref 11.5–15.5)
WBC: 5.5 10*3/uL (ref 4.0–10.5)
nRBC: 0 % (ref 0.0–0.2)

## 2022-07-26 LAB — COMPREHENSIVE METABOLIC PANEL
ALT: 20 U/L (ref 0–44)
AST: 25 U/L (ref 15–41)
Albumin: 2 g/dL — ABNORMAL LOW (ref 3.5–5.0)
Alkaline Phosphatase: 45 U/L (ref 38–126)
Anion gap: 7 (ref 5–15)
BUN: 25 mg/dL — ABNORMAL HIGH (ref 8–23)
CO2: 26 mmol/L (ref 22–32)
Calcium: 8.4 mg/dL — ABNORMAL LOW (ref 8.9–10.3)
Chloride: 100 mmol/L (ref 98–111)
Creatinine, Ser: 0.87 mg/dL (ref 0.44–1.00)
GFR, Estimated: >60 mL/min (ref >60)
Glucose, Bld: 90 mg/dL (ref 70–99)
Potassium: 4.7 mmol/L (ref 3.5–5.1)
Sodium: 133 mmol/L — ABNORMAL LOW (ref 135–145)
Total Bilirubin: 0.4 mg/dL (ref 0.3–1.2)
Total Protein: 5.6 g/dL — ABNORMAL LOW (ref 6.5–8.1)

## 2022-07-26 LAB — C-REACTIVE PROTEIN: CRP: <0.5 mg/dL (ref <1.0)

## 2022-07-26 LAB — SEDIMENTATION RATE: Sed Rate: 103 mm/hr — ABNORMAL HIGH (ref 0–22)

## 2022-07-26 MED ORDER — GADOBUTROL 1 MMOL/ML IV SOLN
7.5000 mL | Freq: Once | INTRAVENOUS | Status: AC | PRN
  Administered 2022-07-26: 7.5 mL via INTRAVENOUS

## 2022-07-26 MED ORDER — LIDOCAINE HCL 2 % IJ SOLN
20.0000 mL | Freq: Once | INTRAMUSCULAR | Status: AC
  Administered 2022-07-27: 400 mg via INTRADERMAL
  Filled 2022-07-26: qty 20

## 2022-07-26 NOTE — ED Provider Notes (Signed)
[facility] Provider Note   CSN: [id number] Arrival date & time: 07/26/22  1504     History  No chief complaint on file.   Angela Church is a 72 y.o. female.  Patient with history HTN, high cholesterol, recent cataract surgery, bilaterally over the past 3 months --presents to the emergency department from her ophthalmologist, Dr. 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  Patient was seen for decreasing visual acuity.  She has had blurry vision without loss of vision or visual field cuts occurring over the past 1 to 2 weeks.  No severe headache.  She denies other strokelike symptoms such as difficulty with speech, facial weakness, extremity weakness, difficulties with her balance.  She was sent with recommendations for MRI brain, orbits, inflammatory markers, CBC and chemistry.       Home Medications Prior to Admission medications   Medication Sig Start Date End Date Taking? Authorizing Provider  amLODipine (NORVASC) 5 MG tablet Take 1 tablet (5 mg total) by mouth daily. 05/29/20   Maximiano Coss, NP  cyclobenzaprine (FLEXERIL) 10 MG tablet Take 1 tablet (10 mg total) by mouth 2 (two) times daily as needed for muscle spasms. 08/22/20   Marcello Fennel, PA-C  lisinopril (ZESTRIL) 10 MG tablet Take 1 tablet (10 mg total) by mouth daily. 05/29/20   Maximiano Coss, NP  rosuvastatin (CRESTOR) 10 MG tablet Take 1 tablet (10 mg total) by mouth daily. 05/29/20   Maximiano Coss, NP      Allergies    Acetaminophen-codeine    Review of Systems   Review of Systems  Physical Exam Updated Vital Signs BP (!) 177/84   Pulse 72   Temp 98.7 F (37.1 C) (Oral)   Resp 16   Ht '5\' 4"'$  (1.626 m)   Wt 72.1 kg   SpO2 100%   BMI 27.28 kg/m   Physical Exam Vitals and nursing note reviewed.  Constitutional:      Appearance: She is well-developed.  HENT:     Head: Normocephalic and atraumatic.     Right Ear: Tympanic membrane, ear  canal and external ear normal.     Left Ear: Tympanic membrane, ear canal and external ear normal.     Nose: Nose normal.     Mouth/Throat:     Pharynx: Uvula midline.  Eyes:     General: Lids are normal.     Extraocular Movements:     Right eye: No nystagmus.     Left eye: No nystagmus.     Conjunctiva/sclera: Conjunctivae normal.     Pupils: Pupils are equal, round, and reactive to light.  Cardiovascular:     Rate and Rhythm: Normal rate and regular rhythm.  Pulmonary:     Effort: Pulmonary effort is normal.     Breath sounds: Normal breath sounds.  Abdominal:     Palpations: Abdomen is soft.     Tenderness: There is no abdominal tenderness.  Musculoskeletal:     Cervical back: Normal range of motion and neck supple. No tenderness or bony tenderness.  Skin:    General: Skin is warm and dry.  Neurological:     Mental Status: She is alert and oriented to person, place, and time.     GCS: GCS eye subscore is 4. GCS verbal subscore is 5. GCS motor subscore is 6.     Cranial Nerves: No cranial nerve deficit.     Sensory: No sensory deficit.     Motor:  No weakness.     Coordination: Coordination normal.     Gait: Gait normal.     Comments:   Upper extremity myotomes tested bilaterally:  C5 Shoulder abduction 5/5 C6 Elbow flexion/wrist extension 5/5 C7 Elbow extension 5/5 C8 Finger flexion 5/5 T1 Finger abduction 5/5  Lower extremity myotomes tested bilaterally: L2 Hip flexion 5/5 L3 Knee extension 5/5 L4 Ankle dorsiflexion 5/5 S1 Ankle plantar flexion 5/5      ED Results / Procedures / Treatments   Labs (all labs ordered are listed, but only abnormal results are displayed) Labs Reviewed  SEDIMENTATION RATE - Abnormal; Notable for the following components:      Result Value   Sed Rate 103 (*)    All other components within normal limits  CBC WITH DIFFERENTIAL/PLATELET - Abnormal; Notable for the following components:   RBC 3.80 (*)    HCT 35.5 (*)    All other  components within normal limits  COMPREHENSIVE METABOLIC PANEL - Abnormal; Notable for the following components:   Sodium 133 (*)    BUN 25 (*)    Calcium 8.4 (*)    Total Protein 5.6 (*)    Albumin 2.0 (*)    All other components within normal limits  CSF CULTURE W GRAM STAIN  CULTURE, FUNGUS WITHOUT SMEAR  ANAEROBIC CULTURE W GRAM STAIN  C-REACTIVE PROTEIN  CSF CELL COUNT WITH DIFFERENTIAL  CSF CELL COUNT WITH DIFFERENTIAL  PROTEIN AND GLUCOSE, CSF  HIV ANTIBODY (ROUTINE TESTING W REFLEX)  OLIGOCLONAL BANDS, CSF + SERM  DRAW EXTRA CLOT TUBE  MENINGITIS/ENCEPHALITIS PANEL (CSF)  MISC [facility] TEST (SEND OUT)  MISC [facility] TEST (SEND OUT)  IGG CSF INDEX  DRAW EXTRA CLOT TUBE  ANA W/REFLEX IF POSITIVE  ANTIPHOSPHOLIPID SYNDROME EVAL, BLD  RPR    EKG None  Radiology MR BRAIN W WO CONTRAST  Result Date: 07/26/2022 CLINICAL DATA:  Initial evaluation for optic neuritis. EXAM: MRI HEAD AND ORBITS WITHOUT AND WITH CONTRAST TECHNIQUE: Multiplanar, multiecho pulse sequences of the brain and surrounding structures were obtained without and with intravenous contrast. Multiplanar, multiecho pulse sequences of the orbits and surrounding structures were obtained including fat saturation techniques, before and after intravenous contrast administration. CONTRAST:  7.24m GADAVIST GADOBUTROL 1 MMOL/ML IV SOLN COMPARISON:  None Available. FINDINGS: MRI HEAD FINDINGS Brain: Cerebral volume within normal limits for age. Scattered patchy T2/FLAIR hyperintensity involving the periventricular, deep, and subcortical white matter both cerebral hemispheres, most characteristic of chronic microvascular ischemic disease, mild in nature. Probable tiny remote lacunar infarct noted at the left lentiform nucleus. Small remote lacunar infarct present at the right frontal corona radiata. No abnormal foci of restricted diffusion to suggest acute or subacute ischemia. Gray-white matter differentiation maintained. No  areas of chronic cortical infarction. No acute or chronic intracranial blood products. No mass lesion, midline shift or mass effect. No hydrocephalus or extra-axial fluid collection. Partially empty sella noted. No abnormal enhancement. Vascular: Major intracranial vascular flow voids are well maintained. Skull and upper cervical spine: Craniocervical junction within normal limits. Bone marrow signal intensity normal. No scalp soft tissue abnormality. Other: No mastoid effusion. MRI ORBITS FINDINGS Orbits: Globes are symmetric in size with normal appearance and morphology. Sequelae of prior bilateral ocular lens replacement. Optic nerves symmetric and normal in appearance. No abnormal intrinsic optic nerve edema or enhancement to suggest acute optic neuritis. No abnormality about either optic nerve sheath complex or evidence for acute Peri neuritis. Intraconal and extraconal fat maintained. Extra-ocular muscles symmetric and normal.  Lacrimal glands normal. No abnormality about the orbital apices or cavernous sinus. Superior orbital veins grossly symmetric and within normal limits. Visualized sinuses: Mild scattered mucosal thickening present about the ethmoidal air cells. Visualized paranasal sinuses are otherwise clear. Soft tissues: Unremarkable. IMPRESSION: MRI HEAD IMPRESSION: 1. No acute intracranial abnormality. 2. 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 MRI ORBITS IMPRESSION: Normal MRI of the orbits. No evidence for acute optic neuritis. Electronically Signed   By: Jeannine Boga M.D.   On: 07/26/2022 19:23   MR ORBITS W WO CONTRAST  Result Date: 07/26/2022 CLINICAL DATA:  Initial evaluation for optic neuritis. EXAM: MRI HEAD AND ORBITS WITHOUT AND WITH CONTRAST TECHNIQUE: Multiplanar, multiecho pulse sequences of the brain and surrounding structures were obtained without and with  intravenous contrast. Multiplanar, multiecho pulse sequences of the orbits and surrounding structures were obtained including fat saturation techniques, before and after intravenous contrast administration. CONTRAST:  7.5m GADAVIST GADOBUTROL 1 MMOL/ML IV SOLN COMPARISON:  None Available. FINDINGS: MRI HEAD FINDINGS Brain: Cerebral volume within normal limits for age. Scattered patchy T2/FLAIR hyperintensity involving the periventricular, deep, and subcortical white matter both cerebral hemispheres, most characteristic of chronic microvascular ischemic disease, mild in nature. Probable tiny remote lacunar infarct noted at the left lentiform nucleus. Small remote lacunar infarct present at the right frontal corona radiata. No abnormal foci of restricted diffusion to suggest acute or subacute ischemia. Gray-white matter differentiation maintained. No areas of chronic cortical infarction. No acute or chronic intracranial blood products. No mass lesion, midline shift or mass effect. No hydrocephalus or extra-axial fluid collection. Partially empty sella noted. No abnormal enhancement. Vascular: Major intracranial vascular flow voids are well maintained. Skull and upper cervical spine: Craniocervical junction within normal limits. Bone marrow signal intensity normal. No scalp soft tissue abnormality. Other: No mastoid effusion. MRI ORBITS FINDINGS Orbits: Globes are symmetric in size with normal appearance and morphology. Sequelae of prior bilateral ocular lens replacement. Optic nerves symmetric and normal in appearance. No abnormal intrinsic optic nerve edema or enhancement to suggest acute optic neuritis. No abnormality about either optic nerve sheath complex or evidence for acute Peri neuritis. Intraconal and extraconal fat maintained. Extra-ocular muscles symmetric and normal. Lacrimal glands normal. No abnormality about the orbital apices or cavernous sinus. Superior orbital veins grossly symmetric and within  normal limits. Visualized sinuses: Mild scattered mucosal thickening present about the ethmoidal air cells. Visualized paranasal sinuses are otherwise clear. Soft tissues: Unremarkable. IMPRESSION: MRI HEAD IMPRESSION: 1. No acute intracranial abnormality. 2. 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 MRI ORBITS IMPRESSION: Normal MRI of the orbits. No evidence for acute optic neuritis. Electronically Signed   By: BJeannine BogaM.D.   On: 07/26/2022 19:23    Procedures Procedures    .Lumbar Puncture  Date/Time: 07/26/2022 10:50 PM  Performed by: GCarlisle Cater PA-C Authorized by: GCarlisle Cater PA-C   Consent:    Consent obtained:  Verbal   Consent given by:  Patient   Risks, benefits, and alternatives were discussed: yes     Risks discussed:  Bleeding, nerve damage, infection, pain, headache and repeat procedure   Alternatives discussed:  No treatment Universal protocol:    Patient identity confirmed:  Verbally with patient, provided demographic data and arm band Pre-procedure details:    Procedure purpose:  Diagnostic   Preparation: Patient was prepped and draped  in usual sterile fashion   Anesthesia:    Anesthesia method:  Local infiltration   Local anesthetic:  Lidocaine 1% w/o epi Procedure details:    Lumbar space:  L4-L5 interspace   Patient position:  L lateral decubitus   Needle gauge:  20   Needle type:  Spinal needle - Quincke tip   Number of attempts:  1 Post-procedure details:    Puncture site:  Adhesive bandage applied   Procedure completion:  Tolerated Comments:     Unable to get CSF return. Patient tolerated well. No immediate complications.     Medications Ordered in ED Medications  lidocaine (XYLOCAINE) 2 % (with pres) injection 400 mg (has no administration in time range)  gadobutrol (GADAVIST) 1 MMOL/ML injection 7.5 mL (7.5 mLs Intravenous  Contrast Given 07/26/22 1844)    ED Course/ Medical Decision Making/ A&P    Patient seen and examined. History obtained directly from patient.   Labs/EKG: Ordered CBC, CMP, ESR, CRP.Marland Kitchen  Imaging: Ordered MRI brain and orbits.  Medications/Fluids: None ordered  Most recent vital signs reviewed and are as follows: BP (!) 177/84   Pulse 72   Temp 98.7 F (37.1 C) (Oral)   Resp 16   Ht '5\' 4"'$  (1.626 m)   Wt 72.1 kg   SpO2 100%   BMI 27.28 kg/m   Initial impression: Papilledema  10:44 PM Reassessment performed. Patient appears stable.   Labs personally reviewed and interpreted including: CBC with normal white blood cell count, normal hemoglobin, platelets normal; CMP sodium 113, normal kidney function; CRP was normal; sed rate markedly elevated at 103.  Imaging personally visualized and interpreted including: MRI brain and orbits agree negative for acute findings  Reviewed pertinent lab work and imaging with patient at bedside. Questions answered.   Most current vital signs reviewed and are as follows: BP (!) 177/84   Pulse 72   Temp 98.7 F (37.1 C) (Oral)   Resp 16   Ht '5\' 4"'$  (1.626 m)   Wt 72.1 kg   SpO2 100%   BMI 27.28 kg/m   Plan: I consulted with Dr. Talbert Forest of ophthalmology by telephone.  He has concerns for giant cell arteritis.  Given vision change, recommends neurology consult.  I consulted with Dr. Curly Shores of neurology who will consult on patient.  She recommends lumbar puncture prior to initiation of IV steroids.  I discussed this plan with patient at bedside.  We discussed risks and benefits of LP.  We discussed giant cell arteritis and potential for permanent blindness if left untreated.  She agrees to proceed.  Patient will need to be moved to a room.  RN contacted.  10:45 PM Reassessment performed. Patient appears stable. LP attempted and was not successful.   Neuro informed of unsuccessful LP.  Plan: Admit to medicine, awaiting neurology  recommendations.                           Medical Decision Making  Patient with progressive vision changes, concern for giant cell arteritis.  Will need admission for LP and likely steroids.        Final Clinical Impression(s) / ED Diagnoses Final diagnoses:  Vision changes    Rx / DC Orders ED Discharge Orders     None         Carlisle Cater, Hershal Coria 07/26/22 2256    Dorie Rank, MD 07/27/22 2359

## 2022-07-26 NOTE — ED Notes (Signed)
Patient back from MRI.

## 2022-07-26 NOTE — ED Triage Notes (Signed)
Pt was sent by ophthalmologist for bilateral optic nerve edema. Ophthalmologist wants pt to get an MRI to r/o mass or venous thrombosis. Pt c/o cloudy visionx2-3wks. Pt denies any other sx. Pt had recent cataract surgery in 11/23 and 12/23

## 2022-07-26 NOTE — ED Notes (Signed)
Providers at bedside for LP at this time.

## 2022-07-26 NOTE — ED Provider Triage Note (Signed)
Emergency Medicine Provider Triage Evaluation Note  Angela Church , a 72 y.o. female  was evaluated in triage.  Pt complains of blurry vision for the past two weeks. Was sent over here from ophtho for MRI and lab work.  They are requesting MRI of brain and orbits as well as an ESR, CRP, CBC, and CMP.  He is concern for possible venous thrombus or GCA given the patient has bilateral optic nerve edema.  I spoke with Dr. Talbert Forest, her ophtho.   Review of Systems  Positive:  Negative:   Physical Exam  BP (!) 182/83 (BP Location: Right Arm)   Pulse 73   Temp 98.9 F (37.2 C) (Oral)   Resp 16   Ht '5\' 4"'$  (1.626 m)   Wt 72.1 kg   SpO2 100%   BMI 27.28 kg/m  Gen:   Awake, no distress   Resp:  Normal effort  MSK:   Moves extremities without difficulty  Other:    Medical Decision Making  Medically screening exam initiated at 3:36 PM.  Appropriate orders placed.  Angela Church was informed that the remainder of the evaluation will be completed by another provider, this initial triage assessment does not replace that evaluation, and the importance of remaining in the ED until their evaluation is complete.  MRI with and without of brain and orbits ordered with the labs requested   Sherrell Puller, Hershal Coria 07/26/22 1538

## 2022-07-26 NOTE — Consult Note (Signed)
Neurology Consultation Reason for Consult: Bilateral papilledema  Requesting Physician: Jennette Kettle  CC: Bilateral papilledema  History is obtained from: Patient and chart review  HPI: Angela Church is a 72 y.o. female with a past medical history significant for hypertension, hyperlipidemia, recently diagnosed hypothyroidism, BMI 27.28, remote smoking, neuropathy  At baseline she is in excellent health and walks up to 5 miles a day, actively rides stationary bike 20 minutes a day.  She had bilateral cataract surgery, the first eye in November and the second December 4.  1 week later she was having some trouble with a dry eye but blurry vision would resolve with refresh drops however she was needing to use them almost every hour for relief.  Unfortunately her's vision was progressively worsening to the point that she began to have trouble reading her Bible at the beginning of February despite using her reading glasses.  On ophthalmology follow-up 1 week ago she was noted to have some mild left eye disc swelling, which on 2/6 was noted to be progressive in both eyes with cystoid macular edema as well as papilledema for which she was referred to the ED for expedited workup and neurology was consulted  On review of systems she notes that she has not had any scalp tenderness, jaw claudication, proximal muscle weakness, episodic vision loss, any significant headaches, or muscle pain.  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  She started noting this in November and it has been worsening; she takes movement breaks anytime she is sitting down so that she does not develop severe stiffness.  She has also had increased fatigue but denies any fevers, chills, sweats or unintentional weight loss although she notes she started using Noom in January and has lost 6-7 pounds with diet (cut out alcohol entirely was drinking once a week or so, changed her diet to eat more  vegetables etc.)   She denies any use of retinoid creams excessive liver consumption, vitamin A supplementation but notes she uses turmeric, collagen, Krill oil, multivitamin for women over 60, coenzyme Q 10 and has been using CBD Gummies occasionally for her hip pain.  Otherwise that she is on levothyroxine, furosemide, lisinopril and rosuvastatin.  She notes that rosuvastatin was started about 8 months ago after she was having a lot of burning pain in her feet in the setting of lower extremity edema which has improved greatly with furosemide.  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  PCP referred her to physical therapy for her hip issues and she has had improvement in her ability to walk up steps after 3 sessions of physical therapy  Premorbid modified rankin scale:      0 - No symptoms.   ROS: All other review of systems was negative except as noted in the HPI.   Past Medical History:  Diagnosis Date   Hyperlipidemia    Hypertension    Family History  Problem Relation Age of Onset   Cancer Mother    Hyperlipidemia Mother    Hypertension Mother    Hyperlipidemia Father    Hypertension Father  Dementia Sister 6   Past Surgical History:  Procedure Laterality Date   APPENDECTOMY     cataract Bilateral    CESAREAN SECTION     CHOLECYSTECTOMY     FINGER SURGERY     Current Outpatient Medications  Medication Instructions   Coenzyme Q10 (CO Q10 PO) 1 capsule, Oral, Daily   COLLAGEN PO 1 capsule, Oral, Daily   furosemide (LASIX) 20 mg, Oral, Daily   KRILL OIL PO 1 capsule, Oral, Daily   levothyroxine (SYNTHROID) 50 mcg,  Oral, Daily before breakfast   lisinopril (ZESTRIL) 10 mg, Oral, Daily   Multiple Vitamins-Minerals (ONE-A-DAY WOMENS 50 PLUS PO) 1 tablet, Oral, Daily   rosuvastatin (CRESTOR) 10 mg, Oral, Daily   TURMERIC PO 1 tablet, Oral, Daily     Social History:  reports that she has quit smoking. Her smoking use included cigarettes. She has never used smokeless tobacco. She reports current alcohol use. She reports that she does not use drugs.  Exam: Current vital signs: BP (!) 177/84   Pulse 72   Temp 98.7 F (37.1 C) (Oral)   Resp 16   Ht '5\' 4"'$  (1.626 m)   Wt 72.1 kg   SpO2 100%   BMI 27.28 kg/m  Vital signs in last 24 hours: Temp:  [98.7 F (37.1 C)-98.9 F (37.2 C)] 98.7 F (37.1 C) (02/06 1750) Pulse Rate:  [72-73] 72 (02/06 1750) Resp:  [16] 16 (02/06 1750) BP: (177-182)/(83-84) 177/84 (02/06 1750) SpO2:  [100 %] 100 % (02/06 1750) Weight:  [72.1 kg] 72.1 kg (02/06 1528)   Physical Exam  Constitutional: Appears well-developed and well-nourished.  Psych: Affect appropriate to situation, pleasant and cooperative Eyes: No scleral injection HENT: No oropharyngeal obstruction.  No temporal artery tenderness, normal temporal artery pulse MSK: no joint deformities.  Cardiovascular: Normal rate and regular rhythm. Perfusing extremities well Respiratory: Effort normal, non-labored breathing GI: Soft.  No distension. There is no tenderness.  Skin: Warm dry and intact visible skin, no obvious rashes on a limited examination  Neuro: Mental Status: Patient is awake, alert, oriented to person, place, month, year, and situation. Patient is able to give a clear and coherent history. No signs of aphasia or neglect Cranial Nerves: II: Visual Fields are full to finger counting. Pupils are equal, round, and reactive to light. Visual acuity can read 20/200 line on near card at a distance of ~5 feet, red desaturation is present. 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  V: Facial sensation is symmetric to light touch VII: Facial movement is symmetric.  VIII: hearing is intact to voice X: Uvula elevates symmetrically XI: Shoulder shrug is symmetric. XII: tongue is midline without atrophy or fasciculations.  Motor: Tone is normal. Bulk is normal. 5/5 strength was present in all four extremities.  Sensory: Sensation is symmetric to light touch and temperature in the arms and legs, length dependent loss of temperature in the legs Deep Tendon Reflexes: 3+ and symmetric in the brachioradialis and patellae.  Plantars: Toes are downgoing bilaterally.  Cerebellar: FNF and HKS are intact bilaterally Gait:  Deferred     I have reviewed labs in epic and the results pertinent to this consultation are:  Basic Metabolic Panel: Recent Labs  Lab 07/26/22 1535  NA 133*  K 4.7  CL 100  CO2 26  GLUCOSE 90  BUN 25*  CREATININE 0.87  CALCIUM 8.4*    CBC: Recent Labs  Lab 07/26/22 1535  WBC  5.5  NEUTROABS 3.1  HGB 12.0  HCT 35.5*  MCV 93.4  PLT 297    Coagulation Studies: No results for input(s): "LABPROT", "INR" in the last 72 hours.    Lab Results  Component Value Date   ESRSEDRATE 103 (H) 07/26/2022    Lab Results  Component Value Date   CRP <0.5 07/26/2022    Lab Results  Component Value Date   TSH 5.200 (H) 04/14/2020    I have reviewed the images obtained:  MRI HEAD IMPRESSION:   1. No acute intracranial abnormality. 2. 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   MRI ORBITS IMPRESSION:   Normal MRI of the orbits. No evidence for acute optic neuritis.   Impression: This is a 72 year old woman with past medical history significant for hypertension and hyperlipidemia now presenting with approximately 1 month of progressive vision loss after cataract surgery found to have bilateral papilledema.  Due to  recent surgeries I do think it is important to rule out any potential infectious process prior to initiating steroids  Recommendations (all orders have been placed) - Lumbar puncture with opening pressure, cells counts tubes 1 and 4, protein, glucose, culture and gram stain (bacterial, fungal, anaerobic), oligoclonal bands, IgG index, NMO, Meningitis/encephalitis PCR panel  -Appreciate ED attempt, which failed unfortunately due to the patient's lumbar arthritis, fluoroscopy LP order placed - Serum: RPR, HIV, ANA, anti-phospholipids, NMO, MOG - Given complaints of arthritis will check rheumatoid factor, CCP - Given recent diagnosis of hypothyroidism will check thyroid antibodies, TSH, T4 - Neurology will follow along, discussed with patient that once lumbar puncture excludes infection would start high-dose steroids with tentative plan for 5-day course  Lesleigh Noe MD-PhD [facility] [phone] Available 7 PM to 7 AM, outside of these hours please call Neurologist on call as listed on Amion. t

## 2022-07-26 NOTE — ED Notes (Signed)
Patient transported to MRI 

## 2022-07-27 ENCOUNTER — Observation Stay (HOSPITAL_COMMUNITY): Payer: Medicare Other

## 2022-07-27 ENCOUNTER — Encounter (HOSPITAL_COMMUNITY): Payer: Self-pay | Admitting: Internal Medicine

## 2022-07-27 DIAGNOSIS — E785 Hyperlipidemia, unspecified: Secondary | ICD-10-CM | POA: Diagnosis present

## 2022-07-27 DIAGNOSIS — H547 Unspecified visual loss: Secondary | ICD-10-CM

## 2022-07-27 DIAGNOSIS — I10 Essential (primary) hypertension: Secondary | ICD-10-CM | POA: Diagnosis not present

## 2022-07-27 DIAGNOSIS — H539 Unspecified visual disturbance: Secondary | ICD-10-CM

## 2022-07-27 DIAGNOSIS — M316 Other giant cell arteritis: Secondary | ICD-10-CM | POA: Insufficient documentation

## 2022-07-27 LAB — CSF CELL COUNT WITH DIFFERENTIAL
RBC Count, CSF: 2 /mm3 — ABNORMAL HIGH
Tube #: 1
WBC, CSF: 1 /mm3 (ref 0–5)

## 2022-07-27 LAB — MENINGITIS/ENCEPHALITIS PANEL (CSF)

## 2022-07-27 LAB — T4, FREE: Free T4: 1.07 ng/dL (ref 0.61–1.12)

## 2022-07-27 LAB — HIV ANTIBODY (ROUTINE TESTING W REFLEX): HIV Screen 4th Generation wRfx: NONREACTIVE

## 2022-07-27 LAB — TSH: TSH: 5.09 u[IU]/mL — ABNORMAL HIGH (ref 0.350–4.500)

## 2022-07-27 LAB — PROTEIN AND GLUCOSE, CSF
Glucose, CSF: 50 mg/dL (ref 40–70)
Total  Protein, CSF: 15 mg/dL (ref 15–45)

## 2022-07-27 LAB — RPR: RPR Ser Ql: NONREACTIVE

## 2022-07-27 MED ORDER — LISINOPRIL 20 MG PO TABS
20.0000 mg | ORAL_TABLET | Freq: Every day | ORAL | Status: DC
  Administered 2022-07-28 – 2022-07-30 (×3): 20 mg via ORAL
  Filled 2022-07-27 (×3): qty 1

## 2022-07-27 MED ORDER — FUROSEMIDE 20 MG PO TABS
20.0000 mg | ORAL_TABLET | Freq: Every day | ORAL | Status: DC
  Administered 2022-07-28 – 2022-07-30 (×3): 20 mg via ORAL
  Filled 2022-07-27 (×3): qty 1

## 2022-07-27 MED ORDER — ONDANSETRON HCL 4 MG/2ML IJ SOLN: 4.0000 mg | Freq: Four times a day (QID) | INTRAMUSCULAR | Status: DC | PRN

## 2022-07-27 MED ORDER — ROSUVASTATIN CALCIUM 20 MG PO TABS
20.0000 mg | ORAL_TABLET | Freq: Every day | ORAL | Status: DC
  Administered 2022-07-27 – 2022-07-29 (×3): 20 mg via ORAL
  Filled 2022-07-27 (×3): qty 1

## 2022-07-27 MED ORDER — LEVOTHYROXINE SODIUM 50 MCG PO TABS
50.0000 ug | ORAL_TABLET | Freq: Every day | ORAL | Status: DC
  Administered 2022-07-29 – 2022-07-30 (×2): 50 ug via ORAL
  Filled 2022-07-27 (×2): qty 1

## 2022-07-27 MED ORDER — LIDOCAINE HCL (PF) 1 % IJ SOLN
5.0000 mL | Freq: Once | INTRAMUSCULAR | Status: AC
  Administered 2022-07-27: 5 mL via INTRADERMAL
  Filled 2022-07-27: qty 5

## 2022-07-27 MED ORDER — ACETAMINOPHEN 325 MG PO TABS
650.0000 mg | ORAL_TABLET | Freq: Four times a day (QID) | ORAL | Status: DC | PRN
  Administered 2022-07-28: 650 mg via ORAL

## 2022-07-27 MED ORDER — LIDOCAINE HCL (PF) 1 % IJ SOLN
5.0000 mL | Freq: Once | INTRAMUSCULAR | Status: AC
  Administered 2022-07-27: 5 mL
  Filled 2022-07-27: qty 5

## 2022-07-27 MED ORDER — ONDANSETRON HCL 4 MG PO TABS: 4.0000 mg | ORAL_TABLET | Freq: Four times a day (QID) | ORAL | Status: DC | PRN

## 2022-07-27 MED ORDER — SODIUM CHLORIDE 0.9 % IV SOLN
1000.0000 mg | INTRAVENOUS | Status: DC
  Administered 2022-07-27 – 2022-07-28 (×2): 1000 mg via INTRAVENOUS
  Filled 2022-07-27 (×4): qty 16

## 2022-07-27 MED ORDER — ACETAMINOPHEN 650 MG RE SUPP: 650.0000 mg | Freq: Four times a day (QID) | RECTAL | Status: DC | PRN

## 2022-07-27 NOTE — ED Notes (Signed)
ED TO INPATIENT HANDOFF REPORT  ED Nurse Name and Phone #: Duanne Guess [phone]   S Name/Age/Gender Angela Church 72 y.o. female Room/Bed: 008C/008C  Code Status   Code Status: Full Code  Triage Complete: Triage complete  Chief Complaint Vision loss [H54.7]  Triage Note Pt was sent by ophthalmologist for bilateral optic nerve edema. Ophthalmologist wants pt to get an MRI to r/o mass or venous thrombosis. Pt c/o cloudy visionx2-3wks. Pt denies any other sx. Pt had recent cataract surgery in 11/23 and 12/23   Allergies Allergies  Allergen Reactions   Acetaminophen-Codeine Other (See Comments)    "Felt out of control"    Level of Care/Admitting Diagnosis ED Disposition     ED Disposition  [place]: [place] [[id number]]  Level of Care: Med-Surg [16]  May place patient in observation at [facility] or [place] if equivalent level of care is available:: No  Covid Evaluation: Asymptomatic - no recent exposure (last 10 days) testing not required  Diagnosis: Vision loss [373428]  Admitting Physician: Etta Quill [[id number]]  Attending Physician: Etta Quill [phone]          B Medical/Surgery History Past Medical History:  Diagnosis Date   Hyperlipidemia    Hypertension    Past Surgical History:  Procedure Laterality Date   APPENDECTOMY     cataract Bilateral    CESAREAN Silver Plume       A IV Location/Drains/Wounds Patient Lines/Drains/Airways Status     Active Line/Drains/Airways     Name Placement date Placement time Site Days   Peripheral IV 07/26/22 20 G Left Antecubital 07/26/22  1748  Antecubital  1            Intake/Output Last 24 hours No intake or output data in the 24 hours ending 07/27/22 6203  Labs/Imaging Results for orders placed or performed during the hospital encounter of 07/26/22 (from the past 48 hour(s))  Sedimentation rate      Status: Abnormal   Collection Time: 07/26/22  3:35 PM  Result Value Ref Range   Sed Rate 103 (H) 0 - 22 mm/hr    Comment: Performed at [facility], [place] [address] [place], [place] [address]  C-reactive protein     Status: None   Collection Time: 07/26/22  3:35 PM  Result Value Ref Range   CRP <0.5 <1.0 mg/dL    Comment: Performed at [facility], [place] [address], [place], [place] [address]  CBC with Differential     Status: Abnormal   Collection Time: 07/26/22  3:35 PM  Result Value Ref Range   WBC 5.5 4.0 - 10.5 K/uL   RBC 3.80 (L) 3.87 - 5.11 MIL/uL   Hemoglobin 12.0 12.0 - 15.0 g/dL   HCT 35.5 (L) 36.0 - 46.0 %   MCV 93.4 80.0 - 100.0 fL   MCH 31.6 26.0 - 34.0 pg   MCHC 33.8 30.0 - 36.0 g/dL   RDW 12.6 11.5 - 15.5 %   Platelets 297 150 - 400 K/uL   nRBC 0.0 0.0 - 0.2 %   Neutrophils Relative % 55 %   Neutro Abs 3.1 1.7 - 7.7 K/uL   Lymphocytes Relative 27 %   Lymphs Abs 1.5 0.7 - 4.0 K/uL   Monocytes Relative 16 %   Monocytes Absolute 0.9 0.1 - 1.0 K/uL  Eosinophils Relative 1 %   Eosinophils Absolute 0.1 0.0 - 0.5 K/uL   Basophils Relative 1 %   Basophils Absolute 0.0 0.0 - 0.1 K/uL   Immature Granulocytes 0 %   Abs Immature Granulocytes 0.01 0.00 - 0.07 K/uL    Comment: Performed at [facility] [address] [place], [place] [address]  Comprehensive metabolic panel     Status: Abnormal   Collection Time: 07/26/22  3:35 PM  Result Value Ref Range   Sodium 133 (L) 135 - 145 mmol/L   Potassium 4.7 3.5 - 5.1 mmol/L   Chloride 100 98 - 111 mmol/L   CO2 26 22 - 32 mmol/L   Glucose, Bld 90 70 - 99 mg/dL    Comment: Glucose reference range applies only to samples taken after fasting for at least 8 hours.   BUN 25 (H) 8 - 23 mg/dL   Creatinine, Ser 0.87 0.44 - 1.00 mg/dL   Calcium 8.4 (L) 8.9 - 10.3 mg/dL   Total Protein 5.6 (L) 6.5 - 8.1 g/dL   Albumin 2.0 (L) 3.5 - 5.0 g/dL   AST 25 15 - 41 U/L   ALT 20 0 - 44 U/L   Alkaline Phosphatase 45 38  - 126 U/L   Total Bilirubin 0.4 0.3 - 1.2 mg/dL   GFR, Estimated >60 >60 mL/min    Comment: (NOTE) Calculated using the CKD-EPI Creatinine Equation (2021)    Anion gap 7 5 - 15    Comment: Performed at [facility], [place] [address], [place], [place] [address]  RPR     Status: None   Collection Time: 07/27/22  4:52 AM  Result Value Ref Range   RPR Ser Ql NON REACTIVE NON REACTIVE    Comment: Performed at [facility], [place] [address] [place], [place] [address]  TSH     Status: Abnormal   Collection Time: 07/27/22  4:52 AM  Result Value Ref Range   TSH 5.090 (H) 0.350 - 4.500 uIU/mL    Comment: Performed by a 3rd Generation assay with a functional sensitivity of <=0.01 uIU/mL. Performed at [facility], [place] [address], [place], [place] [address]   T4, free     Status: None   Collection Time: 07/27/22  4:52 AM  Result Value Ref Range   Free T4 1.07 0.61 - 1.12 ng/dL    Comment: (NOTE) Biotin ingestion may interfere with free T4 tests. If the results are inconsistent with the TSH level, previous test results, or the clinical presentation, then consider biotin interference. If needed, order repeat testing after stopping biotin. Performed at [facility], [place] [address] [place],  [address]    MR BRAIN W WO CONTRAST  Result Date: 07/26/2022 CLINICAL DATA:  Initial evaluation for optic neuritis. EXAM: MRI HEAD AND ORBITS WITHOUT AND WITH CONTRAST TECHNIQUE: Multiplanar, multiecho pulse sequences of the brain and surrounding structures were obtained without and with intravenous contrast. Multiplanar, multiecho pulse sequences of the orbits and surrounding structures were obtained including fat saturation techniques, before and after intravenous contrast administration. CONTRAST:  7.36m GADAVIST GADOBUTROL 1 MMOL/ML IV SOLN COMPARISON:  None Available. FINDINGS: MRI HEAD FINDINGS Brain: Cerebral volume within normal limits for age. Scattered patchy T2/FLAIR  hyperintensity involving the periventricular, deep, and subcortical white matter both cerebral hemispheres, most characteristic of chronic microvascular ischemic disease, mild in nature. Probable tiny remote lacunar infarct noted at the left lentiform nucleus. Small remote lacunar infarct present  at the right frontal corona radiata. No abnormal foci of restricted diffusion to suggest acute or subacute ischemia. Gray-white matter differentiation maintained. No areas of chronic cortical infarction. No acute or chronic intracranial blood products. No mass lesion, midline shift or mass effect. No hydrocephalus or extra-axial fluid collection. Partially empty sella noted. No abnormal enhancement. Vascular: Major intracranial vascular flow voids are well maintained. Skull and upper cervical spine: Craniocervical junction within normal limits. Bone marrow signal intensity normal. No scalp soft tissue abnormality. Other: No mastoid effusion. MRI ORBITS FINDINGS Orbits: Globes are symmetric in size with normal appearance and morphology. Sequelae of prior bilateral ocular lens replacement. Optic nerves symmetric and normal in appearance. No abnormal intrinsic optic nerve edema or enhancement to suggest acute optic neuritis. No abnormality about either optic nerve sheath complex or evidence for acute Peri neuritis. Intraconal and extraconal fat maintained. Extra-ocular muscles symmetric and normal. Lacrimal glands normal. No abnormality about the orbital apices or cavernous sinus. Superior orbital veins grossly symmetric and within normal limits. Visualized sinuses: Mild scattered mucosal thickening present about the ethmoidal air cells. Visualized paranasal sinuses are otherwise clear. Soft tissues: Unremarkable. IMPRESSION: MRI HEAD IMPRESSION: 1. No acute intracranial abnormality. 2. 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 MRI ORBITS IMPRESSION: Normal MRI of the orbits. No evidence for acute optic neuritis. Electronically Signed   By: Jeannine Boga M.D.   On: 07/26/2022 19:23   MR ORBITS W WO CONTRAST  Result Date: 07/26/2022 CLINICAL DATA:  Initial evaluation for optic neuritis. EXAM: MRI HEAD AND ORBITS WITHOUT AND WITH CONTRAST TECHNIQUE: Multiplanar, multiecho pulse sequences of the brain and surrounding structures were obtained without and with intravenous contrast. Multiplanar, multiecho pulse sequences of the orbits and surrounding structures were obtained including fat saturation techniques, before and after intravenous contrast administration. CONTRAST:  7.66m GADAVIST GADOBUTROL 1 MMOL/ML IV SOLN COMPARISON:  None Available. FINDINGS: MRI HEAD FINDINGS Brain: Cerebral volume within normal limits for age. Scattered patchy T2/FLAIR hyperintensity involving the periventricular, deep, and subcortical white matter both cerebral hemispheres, most characteristic of chronic microvascular ischemic disease, mild in nature. Probable tiny remote lacunar infarct noted at the left lentiform nucleus. Small remote lacunar infarct present at the right frontal corona radiata. No abnormal foci of restricted diffusion to suggest acute or subacute ischemia. Gray-white matter differentiation maintained. No areas of chronic cortical infarction. No acute or chronic intracranial blood products. No mass lesion, midline shift or mass effect. No hydrocephalus or extra-axial fluid collection. Partially empty sella noted. No abnormal enhancement. Vascular: Major intracranial vascular flow voids are well maintained. Skull and upper cervical spine: Craniocervical junction within normal limits. Bone marrow signal intensity normal. No scalp soft tissue abnormality. Other: No mastoid effusion. MRI ORBITS FINDINGS Orbits: Globes are symmetric in size with normal appearance and morphology. Sequelae of prior bilateral ocular lens replacement.  Optic nerves symmetric and normal in appearance. No abnormal intrinsic optic nerve edema or enhancement to suggest acute optic neuritis. No abnormality about either optic nerve sheath complex or evidence for acute Peri neuritis. Intraconal and extraconal fat maintained. Extra-ocular muscles symmetric and normal. Lacrimal glands normal. No abnormality about the orbital apices or cavernous sinus. Superior orbital veins grossly symmetric and within normal limits. Visualized sinuses: Mild scattered mucosal thickening present about the ethmoidal air cells. Visualized paranasal sinuses are otherwise clear. Soft tissues: Unremarkable. IMPRESSION: MRI HEAD IMPRESSION: 1. No acute intracranial abnormality. 2. Mild chronic microvascular ischemic  disease with small remote lacunar infarcts involving the left lentiform nucleus and right frontal corona radiata. MRI ORBITS IMPRESSION: Normal MRI of the orbits. No evidence for acute optic neuritis. Electronically Signed   By: Jeannine Boga M.D.   On: 07/26/2022 19:23    Pending Labs Unresulted Labs (From admission, onward)     Start     Ordered   07/27/22 0254  Thyroid antibodies  Once,   URGENT        07/27/22 0254   07/27/22 0253  Rheumatoid factor  Once,   URGENT        07/27/22 0254   [birth year] 1829  CYCLIC CITRUL PEPTIDE ANTIBODY, IGG/IGA  Once,   URGENT        07/27/22 0254   07/26/22 2019  Antiphospholipid syndrome eval, bld  Once,   URGENT        07/26/22 2022   07/26/22 2018  IgG CSF index  (IgG CSF Index, CSF + Serum panel)  Once,   URGENT        07/26/22 2022   07/26/22 2018  Draw extra clot tube  (IgG CSF Index, CSF + Serum panel)  Once,   URGENT        07/26/22 2022   07/26/22 2018  ANA w/Reflex if Positive  Once,   URGENT        07/26/22 2022   07/26/22 2013  Miscellaneous [facility] test (send-out)  Once,   URGENT       Question:  Test name / description:  Answer:  [facility] Myelin Oligodendrocyte Glycoprotein (MOG-IgG1)  Fluorescence-Activated Cell Sorting (FACS) Assay, Serum   07/26/22 2022   07/26/22 2012  Miscellaneous [facility] test (send-out)  Once,   URGENT       Question:  Test name / description:  Answer:  [facility] -- Neuromyelitis Optica (NMO)/Aquaporin-4-IgG Fluorescence-Activated Cell Sorting (FACS) Assay, Spinal Fluid   07/26/22 2022   07/26/22 2011  Culture, fungus without smear  (Meningitis Panel)  Once,   URGENT        07/26/22 2022   07/26/22 2011  Anaerobic culture w Gram Stain  (Meningitis Panel)  Once,   URGENT        07/26/22 2022   07/26/22 2011  Meningitis/Encephalitis Panel (CSF)  Once,   URGENT        07/26/22 2022   07/26/22 2010  CSF cell count with differential collection tube #: 1  (Meningitis Panel)  Once,   STAT       Question:  collection tube #  Answer:  1   07/26/22 2022   07/26/22 2010  CSF cell count with differential collection tube #: 4  (Meningitis Panel)  Once,   STAT       Question:  collection tube #  Answer:  4   07/26/22 2022   07/26/22 2010  CSF culture w Gram Stain  (Meningitis Panel)  Once,   URGENT       Question:  Are there also cytology or pathology orders on this specimen?  Answer:  No   07/26/22 2022   07/26/22 2010  Protein and glucose, CSF  (Meningitis Panel)  Once,   STAT        07/26/22 2022   07/26/22 2010  HIV Antibody (routine testing w rflx)  (Meningitis Panel)  Once,   URGENT        07/26/22 2022   07/26/22 2010  Oligoclonal bands, CSF + serum  (Meningitis Panel)  Once,  URGENT        07/26/22 2022   07/26/22 2010  Draw extra clot tube  (Meningitis Panel)  Once,   URGENT        07/26/22 2022            Vitals/Pain Today's Vitals   07/27/22 0130 07/27/22 0200 07/27/22 0700 07/27/22 0732  BP: (!) 149/64 (!) 161/64 (!) 116/103 (!) 150/51  Pulse: (!) 59 63 (!) 59 73  Resp: '18 17 14 12  '$ Temp:    98.3 F (36.8 C)  TempSrc:    Oral  SpO2: 100% 100% 98% 100%  Weight:      Height:      PainSc:        Isolation Precautions No  active isolations  Medications Medications  acetaminophen (TYLENOL) tablet 650 mg (has no administration in time range)    Or  acetaminophen (TYLENOL) suppository 650 mg (has no administration in time range)  ondansetron (ZOFRAN) tablet 4 mg (has no administration in time range)    Or  ondansetron (ZOFRAN) injection 4 mg (has no administration in time range)  rosuvastatin (CRESTOR) tablet 20 mg (has no administration in time range)  lisinopril (ZESTRIL) tablet 20 mg (has no administration in time range)  levothyroxine (SYNTHROID) tablet 50 mcg (has no administration in time range)  furosemide (LASIX) tablet 20 mg (has no administration in time range)  gadobutrol (GADAVIST) 1 MMOL/ML injection 7.5 mL (7.5 mLs Intravenous Contrast Given 07/26/22 1844)  lidocaine (XYLOCAINE) 2 % (with pres) injection 400 mg (400 mg Intradermal Given 07/27/22 0245)    Mobility walks     Focused Assessments Neuro Assessment Handoff:  Swallow screen pass? Yes    NIH Stroke Scale  Dizziness Present: No Headache Present: No Interval: Shift assessment Level of Consciousness (1a.)   : Alert, keenly responsive LOC Questions (1b. )   : Answers both questions correctly LOC Commands (1c. )   : Performs both tasks correctly Best Gaze (2. )  : Normal Visual (3. )  : Partial hemianopia Facial Palsy (4. )    : Normal symmetrical movements Motor Arm, Left (5a. )   : No drift Motor Arm, Right (5b. ) : No drift Motor Leg, Left (6a. )  : No drift Motor Leg, Right (6b. ) : No drift Limb Ataxia (7. ): Absent Sensory (8. )  : Normal, no sensory loss Best Language (9. )  : No aphasia Dysarthria (10. ): Normal Extinction/Inattention (11.)   : No Abnormality Complete NIHSS TOTAL: 1     Neuro Assessment: Within Defined Limits Neuro Checks:   Initial (07/26/22 1530)  Has TPA been given? No If patient is a Neuro Trauma and patient is going to OR before floor call report to [place] nurse: [phone] or  [phone]   R Recommendations: See Admitting Provider Note  Report given to:   Additional Notes: Lumbar puncture at 1300 on 07/27/2022

## 2022-07-27 NOTE — Progress Notes (Signed)
[facility] PROGRESS NOTE    Progress Note  Angela Church  OMV:[id number] DOB: 01/09/1951 DOA: 07/26/2022 PCP: Shanon Rosser, PA-C     Brief Narrative:   Angela Church is an 72 y.o. female past medical history significant for hypertension recent cataract surgery bilaterally  3 months ago, was sent from the ophthalmologist office for bilateral papilledema, went to the ophthalmologist for decreased visual acuity: Neurology was consulted recommended LP for opening pressure, cell count protein glucose culture and Gram stain, they also recommended a legal, banding IgG index and mL and a meningitis and encephalitis PCR panel.  ED attempted LP which was unsuccessful.  IR was consulted for fluoroscopy.  MRI of the brain showed no acute findings there is chronic microvascular ischemic changes, MRI of the orbit was unremarkable no evidence of optic neuritis     Assessment/Plan:   Progressive bilateral vision loss over the last month: With papilledema on funduscopic exam by ophthalmologist. She has an elevated ESR at 103 with generalized fatigue and new onset of hip pain. Neurology was consulted, SLP was unsuccessful by ED. LP for opening pressure, cell count protein glucose cultures Gram stain, oligoclonal banding and mL meningitis/encephalitis PCR. They also recommended RPR HIV ANA antiphospholipid antibody and MOG. She had been complaining of some joint pain, check a rheumatoid factor, CCP. Neurology recommended to rule out infection prior to starting high-dose steroids tentatively 5-day course. Further management per neurology. LP has an order ASAP. TSH 5.50  Hyperlipidemia: Continue statins.  Essential hypertension: Continue on Lasix and lisinopril blood pressure seems to be relatively stable.   DVT prophylaxis: lovenox Family Communication:none Status is: Observation The patient remains OBS appropriate and will d/c before 2 midnights.    Code Status:     Code Status Orders   (From admission, onward)           Start     Ordered   07/27/22 0428  Full code  Continuous       Question:  By:  Answer:  Other   07/27/22 0429           Code Status History     This patient has a current code status but no historical code status.         IV Access:   Peripheral IV   Procedures and diagnostic studies:   MR BRAIN W WO CONTRAST  Result Date: 07/26/2022 CLINICAL DATA:  Initial evaluation for optic neuritis. EXAM: MRI HEAD AND ORBITS WITHOUT AND WITH CONTRAST TECHNIQUE: Multiplanar, multiecho pulse sequences of the brain and surrounding structures were obtained without and with intravenous contrast. Multiplanar, multiecho pulse sequences of the orbits and surrounding structures were obtained including fat saturation techniques, before and after intravenous contrast administration. CONTRAST:  7.31m GADAVIST GADOBUTROL 1 MMOL/ML IV SOLN COMPARISON:  None Available. FINDINGS: MRI HEAD FINDINGS Brain: Cerebral volume within normal limits for age. Scattered patchy T2/FLAIR hyperintensity involving the periventricular, deep, and subcortical white matter both cerebral hemispheres, most characteristic of chronic microvascular ischemic disease, mild in nature. Probable tiny remote lacunar infarct noted at the left lentiform nucleus. Small remote lacunar infarct present at the right frontal corona radiata. No abnormal foci of restricted diffusion to suggest acute or subacute ischemia. Gray-white matter differentiation maintained. No areas of chronic cortical infarction. No acute or chronic intracranial blood products. No mass lesion, midline shift or mass effect. No hydrocephalus or extra-axial fluid collection. Partially empty sella noted. No abnormal enhancement. Vascular: Major intracranial vascular flow voids are well maintained. Skull and  upper cervical spine: Craniocervical junction within normal limits. Bone marrow signal intensity normal. No scalp soft tissue  abnormality. Other: No mastoid effusion. MRI ORBITS FINDINGS Orbits: Globes are symmetric in size with normal appearance and morphology. Sequelae of prior bilateral ocular lens replacement. Optic nerves symmetric and normal in appearance. No abnormal intrinsic optic nerve edema or enhancement to suggest acute optic neuritis. No abnormality about either optic nerve sheath complex or evidence for acute Peri neuritis. Intraconal and extraconal fat maintained. Extra-ocular muscles symmetric and normal. Lacrimal glands normal. No abnormality about the orbital apices or cavernous sinus. Superior orbital veins grossly symmetric and within normal limits. Visualized sinuses: Mild scattered mucosal thickening present about the ethmoidal air cells. Visualized paranasal sinuses are otherwise clear. Soft tissues: Unremarkable. IMPRESSION: MRI HEAD IMPRESSION: 1. No acute intracranial abnormality. 2. 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 MRI ORBITS IMPRESSION: Normal MRI of the orbits. No evidence for acute optic neuritis. Electronically Signed   By: Jeannine Boga M.D.   On: 07/26/2022 19:23   MR ORBITS W WO CONTRAST  Result Date: 07/26/2022 CLINICAL DATA:  Initial evaluation for optic neuritis. EXAM: MRI HEAD AND ORBITS WITHOUT AND WITH CONTRAST TECHNIQUE: Multiplanar, multiecho pulse sequences of the brain and surrounding structures were obtained without and with intravenous contrast. Multiplanar, multiecho pulse sequences of the orbits and surrounding structures were obtained including fat saturation techniques, before and after intravenous contrast administration. CONTRAST:  7.17m GADAVIST GADOBUTROL 1 MMOL/ML IV SOLN COMPARISON:  None Available. FINDINGS: MRI HEAD FINDINGS Brain: Cerebral volume within normal limits for age. Scattered patchy T2/FLAIR hyperintensity involving the periventricular, deep, and  subcortical white matter both cerebral hemispheres, most characteristic of chronic microvascular ischemic disease, mild in nature. Probable tiny remote lacunar infarct noted at the left lentiform nucleus. Small remote lacunar infarct present at the right frontal corona radiata. No abnormal foci of restricted diffusion to suggest acute or subacute ischemia. Gray-white matter differentiation maintained. No areas of chronic cortical infarction. No acute or chronic intracranial blood products. No mass lesion, midline shift or mass effect. No hydrocephalus or extra-axial fluid collection. Partially empty sella noted. No abnormal enhancement. Vascular: Major intracranial vascular flow voids are well maintained. Skull and upper cervical spine: Craniocervical junction within normal limits. Bone marrow signal intensity normal. No scalp soft tissue abnormality. Other: No mastoid effusion. MRI ORBITS FINDINGS Orbits: Globes are symmetric in size with normal appearance and morphology. Sequelae of prior bilateral ocular lens replacement. Optic nerves symmetric and normal in appearance. No abnormal intrinsic optic nerve edema or enhancement to suggest acute optic neuritis. No abnormality about either optic nerve sheath complex or evidence for acute Peri neuritis. Intraconal and extraconal fat maintained. Extra-ocular muscles symmetric and normal. Lacrimal glands normal. No abnormality about the orbital apices or cavernous sinus. Superior orbital veins grossly symmetric and within normal limits. Visualized sinuses: Mild scattered mucosal thickening present about the ethmoidal air cells. Visualized paranasal sinuses are otherwise clear. Soft tissues: Unremarkable. IMPRESSION: MRI HEAD IMPRESSION: 1. No acute intracranial abnormality. 2. 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 MRI ORBITS IMPRESSION: Normal MRI of the orbits.  No evidence for acute optic neuritis. Electronically Signed   By: BJeannine BogaM.D.   On: 07/26/2022 19:23     Medical Consultants:   None.   Subjective:    Angela Waskeyno complaints no pain, she  relates she is just hungry and wants to sleep.  Objective:    Vitals:   07/26/22 2145 07/27/22 0050 07/27/22 0130 07/27/22 0200  BP: (!) 153/55 (!) 181/63 (!) 149/64 (!) 161/64  Pulse: 76 63 (!) 59 63  Resp: (!) '21 18 18 17  '$ Temp:  98.2 F (36.8 C)    TempSrc:      SpO2: 100% 100% 100% 100%  Weight:      Height:       SpO2: 100 %  No intake or output data in the 24 hours ending 07/27/22 0653 Filed Weights   07/26/22 1528  Weight: 72.1 kg    Exam: General exam: In no acute distress. Respiratory system: Good air movement and clear to auscultation. Cardiovascular system: S1 & S2 heard, RRR. No JVD. Gastrointestinal system: Abdomen is nondistended, soft and nontender.  Extremities: No pedal edema. Skin: No rashes, lesions or ulcers Psychiatry: Judgement and insight appear normal. Mood & affect appropriate.    Data Reviewed:    Labs: Basic Metabolic Panel: Recent Labs  Lab 07/26/22 1535  NA 133*  K 4.7  CL 100  CO2 26  GLUCOSE 90  BUN 25*  CREATININE 0.87  CALCIUM 8.4*   GFR Estimated Creatinine Clearance: 58.6 mL/min (by C-G formula based on SCr of 0.87 mg/dL). Liver Function Tests: Recent Labs  Lab 07/26/22 1535  AST 25  ALT 20  ALKPHOS 45  BILITOT 0.4  PROT 5.6*  ALBUMIN 2.0*   No results for input(s): "LIPASE", "AMYLASE" in the last 168 hours. No results for input(s): "AMMONIA" in the last 168 hours. Coagulation profile No results for input(s): "INR", "PROTIME" in the last 168 hours. COVID-19 Labs  Recent Labs    07/26/22 1535  CRP <0.5    No results found for: "SARSCOV2NAA"  CBC: Recent Labs  Lab 07/26/22 1535  WBC 5.5  NEUTROABS 3.1  HGB 12.0  HCT 35.5*  MCV 93.4  PLT 297   Cardiac Enzymes: No results for  input(s): "CKTOTAL", "CKMB", "CKMBINDEX", "TROPONINI" in the last 168 hours. BNP (last 3 results) No results for input(s): "PROBNP" in the last 8760 hours. CBG: No results for input(s): "GLUCAP" in the last 168 hours. D-Dimer: No results for input(s): "DDIMER" in the last 72 hours. Hgb A1c: No results for input(s): "HGBA1C" in the last 72 hours. Lipid Profile: No results for input(s): "CHOL", "HDL", "LDLCALC", "TRIG", "CHOLHDL", "LDLDIRECT" in the last 72 hours. Thyroid function studies: Recent Labs    07/27/22 0452  TSH 5.090*   Anemia work up: No results for input(s): "VITAMINB12", "FOLATE", "FERRITIN", "TIBC", "IRON", "RETICCTPCT" in the last 72 hours. Sepsis Labs: Recent Labs  Lab 07/26/22 1535  WBC 5.5   Microbiology No results found for this or any previous visit (from the past 240 hour(s)).   Medications:    [START ON 07/28/2022] furosemide  20 mg Oral Daily   [START ON 07/28/2022] levothyroxine  50 mcg Oral QAC breakfast   [START ON 07/28/2022] lisinopril  20 mg Oral Daily   rosuvastatin  20 mg Oral Daily   Continuous Infusions:    LOS: 0 days   Charlynne Cousins  [facility]  07/27/2022, 6:53 AM

## 2022-07-27 NOTE — Progress Notes (Signed)
Patient is back on the floor at 1415pm after the procedure via bed. Kept the Patient supine position on the bed and health education given.

## 2022-07-27 NOTE — H&P (Signed)
History and Physical    Patient: Angela Church DOB: Jul 04, 1950 DOA: 07/26/2022 DOS: the patient was seen and examined on 07/27/2022 PCP: Shanon Rosser, PA-C  Patient coming from: Home  Chief Complaint: Vision loss  HPI: Angela Church is a 72 y.o. female with medical history significant of HTN, HLD, recent cataract surgery bilaterally over past 3 months.  Pt in to ED from ophthalmologist office earlier today for evaluation of bilateral papilledema.  Pt seen at ophthalmologist earlier today for decreasing visual acuity.  Blurry vision without loss of vision or visual field cuts that has been progressing, both eyes, over past 1-2 weeks: On ophthalmology follow-up 1 week ago she was noted to have some mild left eye disc swelling, which on 2/6 was noted to be progressive in both eyes with cystoid macular edema as well as papilledema for which she was referred to the ED for expedited workup.  No severe headache.  No other stroke like symptoms, no jaw claudication, no fever.  Has had subacute onset of B hip pain starting in November, worsening.  Associated stiffness.  Has had generalized fatigue.   Review of Systems: As mentioned in the history of present illness. All other systems reviewed and are negative. Past Medical History:  Diagnosis Date   Hyperlipidemia    Hypertension    Past Surgical History:  Procedure Laterality Date   APPENDECTOMY     cataract Bilateral    CESAREAN SECTION     CHOLECYSTECTOMY     FINGER SURGERY     Social History:  reports that she has quit smoking. Her smoking use included cigarettes. She has never used smokeless tobacco. She reports current alcohol use. She reports that she does not use drugs.  Allergies  Allergen Reactions   Acetaminophen-Codeine Other (See Comments)    "Felt out of control"    Family History  Problem Relation Age of Onset   Cancer Mother    Hyperlipidemia Mother    Hypertension Mother    Hyperlipidemia Father     Hypertension Father    Dementia Sister 39    Prior to Admission medications   Medication Sig Start Date End Date Taking? Authorizing Provider  Coenzyme Q10 (CO Q10 PO) Take 1 capsule by mouth daily.   Yes [provider]  COLLAGEN PO Take 1 capsule by mouth daily.   Yes [provider]  furosemide (LASIX) 20 MG tablet Take 20 mg by mouth daily. 06/30/22  Yes [provider]  KRILL OIL PO Take 1 capsule by mouth daily.   Yes [provider]  levothyroxine (SYNTHROID) 50 MCG tablet Take 50 mcg by mouth daily before breakfast. 04/22/22  Yes [provider]  lisinopril (ZESTRIL) 10 MG tablet Take 1 tablet (10 mg total) by mouth daily. Patient taking differently: Take 20 mg by mouth daily. 05/29/20  Yes Maximiano Coss, NP  Multiple Vitamins-Minerals (ONE-A-DAY WOMENS 50 PLUS PO) Take 1 tablet by mouth daily.   Yes [provider]  rosuvastatin (CRESTOR) 10 MG tablet Take 1 tablet (10 mg total) by mouth daily. Patient taking differently: Take 20 mg by mouth daily. 05/29/20  Yes Maximiano Coss, NP  TURMERIC PO Take 1 tablet by mouth daily.   Yes [provider]    Physical Exam: Vitals:   07/26/22 2145 07/27/22 0050 07/27/22 0130 07/27/22 0200  BP: (!) 153/55 (!) 181/63 (!) 149/64 (!) 161/64  Pulse: 76 63 (!) 59 63  Resp: (!) '21 18 18 17  '$ Temp:  98.2  F (36.8 C)    TempSrc:      SpO2: 100% 100% 100% 100%  Weight:      Height:       Constitutional: NAD, calm, comfortable Eyes: can read 20/200 line on near card at ~5 feet, B papilledema present Respiratory: clear to auscultation bilaterally, no wheezing, no crackles. Normal respiratory effort. No accessory muscle use.  Cardiovascular: Regular rate and rhythm, no murmurs / rubs / gallops. No extremity edema. 2+ pedal pulses. No carotid bruits.  Abdomen: no tenderness, no masses palpated. No hepatosplenomegaly. Bowel sounds positive.  Neurologic: Grossly non-focal Psychiatric:  Normal judgment and insight. Alert and oriented x 3. Normal mood.   Data Reviewed:      Assessment and Plan: * Vision loss Progressive binocular vision loss over past month in first 1 then the other eye.  Papiledema.  Concern for possible GCA. ESR elevated, does have increased generalized fatigue and new onset hip arthritis. GCA possible, but neurology recommends that we hold off on steroids for a couple of hours to get an LP first (see Dr. Lyn Records note for further discussion here) Will defer to neurologist judgement in this regard.  True that patient doesn't have many of the usual symptoms seen with GCA, specifically no: headache, jaw claudication, strokes on MRI, fever, limb claudication. Lab work per Neuro recs: Serum: RPR, HIV, ANA, anti-phospholipids, NMO, MOG check rheumatoid factor, CCP check thyroid antibodies, TSH, T4 Neurology discussed with patient that once lumbar puncture excludes infection would start high-dose steroids with tentative plan for 5-day course IR order for LP ASAP placed (unable to be obtained by EDP who attempted)  HLD (hyperlipidemia) Cont Statin  HTN (hypertension) Cont home BP meds      Advance Care Planning:   Code Status: Full Code  Consults: Dr. Curly Shores (see consult note)  Family Communication: No family in room  Severity of Illness: The appropriate patient status for this patient is OBSERVATION. Observation status is judged to be reasonable and necessary in order to provide the required intensity of service to ensure the patient's safety. The patient's presenting symptoms, physical exam findings, and initial radiographic and laboratory data in the context of their medical condition is felt to place them at decreased risk for further clinical deterioration. Furthermore, it is anticipated that the patient will be medically stable for discharge from the hospital within 2 midnights of admission.   Author: Etta Quill., DO 07/27/2022 5:01  AM  For on call review [email or web address].

## 2022-07-27 NOTE — ED Notes (Cosign Needed)
Pt assisted to restroom. Observed steady gait. Patient has no complaints at this time.

## 2022-07-27 NOTE — Progress Notes (Signed)
Patient was taken off the floor for the procedure (Lumbar puncture) at 1245pm via bed.

## 2022-07-27 NOTE — Assessment & Plan Note (Signed)
Cont Statin. ?

## 2022-07-27 NOTE — Progress Notes (Signed)
SCD placed per order. Patient is satisfied.

## 2022-07-27 NOTE — Assessment & Plan Note (Addendum)
Progressive binocular vision loss over past month in first 1 then the other eye.  Papiledema.  Concern for possible GCA. ESR elevated, does have increased generalized fatigue and new onset hip arthritis. GCA possible, but neurology recommends that we hold off on steroids for a couple of hours to get an LP first (see Dr. Lyn Records note for further discussion here) Will defer to neurologist judgement in this regard.  True that patient doesn't have many of the usual symptoms seen with GCA, specifically no: headache, jaw claudication, strokes on MRI, fever, limb claudication. Lab work per Neuro recs: Serum: RPR, HIV, ANA, anti-phospholipids, NMO, MOG check rheumatoid factor, CCP check thyroid antibodies, TSH, T4 Neurology discussed with patient that once lumbar puncture excludes infection would start high-dose steroids with tentative plan for 5-day course IR order for LP ASAP placed (unable to be obtained by EDP who attempted)

## 2022-07-27 NOTE — Consult Note (Signed)
[facility] [place]  ASSESSMENT / PLAN: 72 y.o. female with visual changes and papilledema. Neurology concerned for possible temporal arteritis. Plan left temporal artery biopsy tomorrow in OR. NPO after midnight.   CHIEF COMPLAINT: visual loss  HISTORY OF PRESENT ILLNESS: Angela Church is a 72 y.o. female admitted to the hospital for progressive visual loss and papilledema.  She recently underwent cataract surgery 3 months ago.  Neurology was consulted.  Workup was initiated.  Concern was raised for temporal arteritis.  The patient does not have classic symptoms of temporal arteritis.  She denies headache, jaw claudication.  She does have visual changes as described above.  Her sedimentation rate is elevated.  Past Medical History:  Diagnosis Date   Hyperlipidemia    Hypertension     Past Surgical History:  Procedure Laterality Date   APPENDECTOMY     cataract Bilateral    CESAREAN SECTION     CHOLECYSTECTOMY     FINGER SURGERY      Family History  Problem Relation Age of Onset   Cancer Mother    Hyperlipidemia Mother    Hypertension Mother    Hyperlipidemia Father    Hypertension Father    Dementia Sister 73    Social History   Socioeconomic History   Marital status: Single    Spouse name: Not on file   Number of children: Not on file   Years of education: Not on file   Highest education level: Not on file  Occupational History   Not on file  Tobacco Use   Smoking status: Former    Years: 15.00    Types: Cigarettes   Smokeless tobacco: Never   Tobacco comments:    apx quit year 2010  Vaping Use   Vaping Use: Never used  Substance and Sexual Activity   Alcohol use: Yes    Comment: occ   Drug use: Never   Sexual activity: Not Currently  Other Topics Concern   Not on file  Social History Narrative   Not on file   Social Determinants of Health   Financial Resource Strain: Not on file  Food Insecurity: No Food Insecurity  (07/27/2022)   Hunger Vital Sign    Worried About Running Out of Food in the Last Year: Never true    Ran Out of Food in the Last Year: Never true  Transportation Needs: No Transportation Needs (07/27/2022)   PRAPARE - Hydrologist (Medical): No    Lack of Transportation (Non-Medical): No  Physical Activity: Not on file  Stress: Not on file  Social Connections: Not on file  Intimate Partner Violence: Not At Risk (07/27/2022)   Humiliation, Afraid, Rape, and Kick questionnaire    Fear of Current or Ex-Partner: No    Emotionally Abused: No    Physically Abused: No    Sexually Abused: No    Allergies  Allergen Reactions   Acetaminophen-Codeine Other (See Comments)    "Felt out of control"    Current Facility-Administered Medications  Medication Dose Route Frequency Provider Last Rate Last Admin   acetaminophen (TYLENOL) tablet 650 mg  650 mg Oral Q6H PRN Etta Quill, DO       Or   acetaminophen (TYLENOL) suppository 650 mg  650 mg Rectal Q6H PRN Etta Quill, DO       [START ON 07/28/2022] furosemide (LASIX) tablet 20 mg  20 mg Oral Daily Etta Quill, DO       [  START ON 07/28/2022] levothyroxine (SYNTHROID) tablet 50 mcg  50 mcg Oral QAC breakfast Etta Quill, DO       [START ON 07/28/2022] lisinopril (ZESTRIL) tablet 20 mg  20 mg Oral Daily Alcario Drought, Jared M, DO       methylPREDNISolone sodium succinate (SOLU-MEDROL) 1,000 mg in sodium chloride 0.9 % 50 mL IVPB  1,000 mg Intravenous Q24H Amie Portland, MD       ondansetron [facility]) tablet 4 mg  4 mg Oral Q6H PRN Etta Quill, DO       Or   ondansetron [facility]) injection 4 mg  4 mg Intravenous Q6H PRN Etta Quill, DO       rosuvastatin (CRESTOR) tablet 20 mg  20 mg Oral Daily Jennette Kettle M, DO   20 mg at 07/27/22 1642    PHYSICAL EXAM Vitals:   07/27/22 0700 07/27/22 0732 07/27/22 1030 07/27/22 1640  BP: (!) 116/103 (!) 150/51 (!) 159/58 (!) 161/65  Pulse: (!) 59 73 65 67  Resp:  '14 12 16 17  '$ Temp:  98.3 F (36.8 C) 98.4 F (36.9 C) 98.3 F (36.8 C)  TempSrc:  Oral Oral Oral  SpO2: 98% 100% 99% 100%  Weight:      Height:       Well-appearing woman in no acute distress Regular rhythm Unlabored breathing No palpable temporal pulse  PERTINENT LABORATORY AND RADIOLOGIC DATA  Most recent CBC    Latest Ref Rng & Units 07/26/2022    3:35 PM 04/14/2020    2:52 PM  CBC  WBC 4.0 - 10.5 K/uL 5.5  5.5   Hemoglobin 12.0 - 15.0 g/dL 12.0  14.6   Hematocrit 36.0 - 46.0 % 35.5  42.1   Platelets 150 - 400 K/uL 297       Most recent CMP    Latest Ref Rng & Units 07/26/2022    3:35 PM 04/14/2020    2:52 PM  CMP  Glucose 70 - 99 mg/dL 90  104   BUN 8 - 23 mg/dL 25  14   Creatinine 0.44 - 1.00 mg/dL 0.87  0.63   Sodium 135 - 145 mmol/L 133  138   Potassium 3.5 - 5.1 mmol/L 4.7  5.1   Chloride 98 - 111 mmol/L 100  102   CO2 22 - 32 mmol/L 26  25   Calcium 8.9 - 10.3 mg/dL 8.4  9.5   Total Protein 6.5 - 8.1 g/dL 5.6  5.8   Total Bilirubin 0.3 - 1.2 mg/dL 0.4  0.3   Alkaline Phos 38 - 126 U/L 45  66   AST 15 - 41 U/L 25  21   ALT 0 - 44 U/L 20  18     Renal function Estimated Creatinine Clearance: 58.6 mL/min (by C-G formula based on SCr of 0.87 mg/dL).  Hgb A1c MFr Bld (%)  Date Value  04/14/2020 5.7 (H)    LDL Chol Calc (NIH)  Date Value Ref Range Status  04/14/2020 156 (H) 0 - 99 mg/dL Final     Yevonne Aline. Stanford Breed, MD FACS [facility] Phone Number: [phone] 07/27/2022 7:50 PM   Total time spent on preparing this encounter including chart review, data review, collecting history, examining the patient, coordinating care for this new patient, 60 minutes.  Portions of this report may have been transcribed using voice recognition software.  Every effort has been made to ensure accuracy; however, inadvertent computerized transcription errors  may still be present.

## 2022-07-27 NOTE — Procedures (Signed)
Technically successful fluoro guided LP at L3-4 level with opening pressure of 14 cm H2O   16 cc of clear, colorless CSF sent to lab for analysis.  No immediate post procedural complication.   Bedrest with bathroom privileges and HOB flat x 6 hours, encourage PO fluids for next 24 hours.  Please see imaging section of Epic for full dictation.  Candiss Norse, PA-C

## 2022-07-27 NOTE — Progress Notes (Signed)
Neurology Progress Note   S:// Seen and examined Awaiting LP ESR 103  CRP normal  O:// Current vital signs: BP (!) 150/51 (BP Location: Right Arm)   Pulse 73   Temp 98.3 F (36.8 C) (Oral)   Resp 12   Ht '5\' 4"'$  (1.626 m)   Wt 72.1 kg   SpO2 100%   BMI 27.28 kg/m  Vital signs in last 24 hours: Temp:  [98.2 F (36.8 C)-98.9 F (37.2 C)] 98.3 F (36.8 C) (02/07 0732) Pulse Rate:  [59-76] 73 (02/07 0732) Resp:  [12-21] 12 (02/07 0732) BP: (116-182)/(51-103) 150/51 (02/07 0732) SpO2:  [98 %-100 %] 100 % (02/07 0732) Weight:  [72.1 kg] 72.1 kg (02/06 1528) Physical Exam  Constitutional: Appears well-developed and well-nourished.  Psych: Affect appropriate to situation, pleasant and cooperative Eyes: No scleral injection HENT: No oropharyngeal obstruction.  No temporal artery tenderness, normal temporal artery pulse MSK: no joint deformities.  Cardiovascular: Normal rate and regular rhythm. Perfusing extremities well Respiratory: Effort normal, non-labored breathing GI: Soft.  No distension. There is no tenderness.  Skin: Warm dry and intact visible skin, no obvious rashes on a limited examination Neurological examination: Awake alert oriented x 3.  Edentulous but not dysarthric.  No aphasia.  Cranial nerve examination revealed equal pupils round reactive to light, visual acuity unchanged since yesterday, did not perform funduscopic examination but had bilateral papilledema noted by the ophthalmologist yesterday, no extraocular movement restriction, face sensation and strength symmetric, tongue and palate midline.  Motor examination with normal bulk and tone and normal strength in all extremities.  Sensation intact everywhere.  No coordination deficits noted.  Medications  Current Facility-Administered Medications:    acetaminophen (TYLENOL) tablet 650 mg, 650 mg, Oral, Q6H PRN **OR** acetaminophen (TYLENOL) suppository 650 mg, 650 mg, Rectal, Q6H PRN, Etta Quill, DO    [START ON 07/28/2022] furosemide (LASIX) tablet 20 mg, 20 mg, Oral, Daily, Alcario Drought, Jared M, DO   [START ON 07/28/2022] levothyroxine (SYNTHROID) tablet 50 mcg, 50 mcg, Oral, QAC breakfast, Alcario Drought, Jared M, DO   [START ON 07/28/2022] lisinopril (ZESTRIL) tablet 20 mg, 20 mg, Oral, Daily, Alcario Drought, Jared M, DO   ondansetron (ZOFRAN) tablet 4 mg, 4 mg, Oral, Q6H PRN **OR** ondansetron (ZOFRAN) injection 4 mg, 4 mg, Intravenous, Q6H PRN, Alcario Drought, Jared M, DO   rosuvastatin (CRESTOR) tablet 20 mg, 20 mg, Oral, Daily, Alcario Drought, Jared M, DO  Current Outpatient Medications:    Coenzyme Q10 (CO Q10 PO), Take 1 capsule by mouth daily., Disp: , Rfl:    COLLAGEN PO, Take 1 capsule by mouth daily., Disp: , Rfl:    furosemide (LASIX) 20 MG tablet, Take 20 mg by mouth daily., Disp: , Rfl:    KRILL OIL PO, Take 1 capsule by mouth daily., Disp: , Rfl:    levothyroxine (SYNTHROID) 50 MCG tablet, Take 50 mcg by mouth daily before breakfast., Disp: , Rfl:    lisinopril (ZESTRIL) 10 MG tablet, Take 1 tablet (10 mg total) by mouth daily. (Patient taking differently: Take 20 mg by mouth daily.), Disp: 90 tablet, Rfl: 1   Multiple Vitamins-Minerals (ONE-A-DAY WOMENS 50 PLUS PO), Take 1 tablet by mouth daily., Disp: , Rfl:    rosuvastatin (CRESTOR) 10 MG tablet, Take 1 tablet (10 mg total) by mouth daily. (Patient taking differently: Take 20 mg by mouth daily.), Disp: 90 tablet, Rfl: 1   TURMERIC PO, Take 1 tablet by mouth daily., Disp: , Rfl:  Labs CBC    Component Value Date/Time  WBC 5.5 07/26/2022 1535   RBC 3.80 (L) 07/26/2022 1535   HGB 12.0 07/26/2022 1535   HGB 14.6 04/14/2020 1452   HCT 35.5 (L) 07/26/2022 1535   HCT 42.1 04/14/2020 1452   PLT 297 07/26/2022 1535   MCV 93.4 07/26/2022 1535   MCV 94 04/14/2020 1452   MCH 31.6 07/26/2022 1535   MCHC 33.8 07/26/2022 1535   RDW 12.6 07/26/2022 1535   RDW 11.8 04/14/2020 1452   LYMPHSABS 1.5 07/26/2022 1535   LYMPHSABS 1.6 04/14/2020 1452   MONOABS 0.9  07/26/2022 1535   EOSABS 0.1 07/26/2022 1535   EOSABS 0.1 04/14/2020 1452   BASOSABS 0.0 07/26/2022 1535   BASOSABS 0.0 04/14/2020 1452    CMP     Component Value Date/Time   NA 133 (L) 07/26/2022 1535   NA 138 04/14/2020 1452   K 4.7 07/26/2022 1535   CL 100 07/26/2022 1535   CO2 26 07/26/2022 1535   GLUCOSE 90 07/26/2022 1535   BUN 25 (H) 07/26/2022 1535   BUN 14 04/14/2020 1452   CREATININE 0.87 07/26/2022 1535   CALCIUM 8.4 (L) 07/26/2022 1535   PROT 5.6 (L) 07/26/2022 1535   PROT 5.8 (L) 04/14/2020 1452   ALBUMIN 2.0 (L) 07/26/2022 1535   ALBUMIN 3.8 04/14/2020 1452   AST 25 07/26/2022 1535   ALT 20 07/26/2022 1535   ALKPHOS 45 07/26/2022 1535   BILITOT 0.4 07/26/2022 1535   BILITOT 0.3 04/14/2020 1452   GFRNONAA >60 07/26/2022 1535   GFRAA 107 04/14/2020 1452    Lipid Panel     Component Value Date/Time   CHOL 279 (H) 04/14/2020 1452   TRIG 182 (H) 04/14/2020 1452   HDL 91 04/14/2020 1452   CHOLHDL 3.1 04/14/2020 1452   LDLCALC 156 (H) 04/14/2020 1452     Imaging I have reviewed images in epic and the results pertinent to this consultation are: MRI brain and orbits unremarkable for acute process.  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 MR brain with contrast also unremarkable for any evidence of venous sinus thrombosis.  Assessment: 72 year old woman past history of hypertension hyperlipidemia with 1 month of progressive vision loss after cataract surgery, transferred to the ER from the ophthalmology office for bilateral papilledema. Has elevated sed rate, normal CRP and normal brain imaging including MRI brain and orbits as well as no evidence of dural venous sinus thrombosis. Has no jaw claudication or temporal tenderness.  No headaches.  No reports of amaurosis fugax Although GCA is less likely based on clinical symptoms, bilateral papilledema and severely  elevated sed rate '@103'$  does require serious eval to r/o GCA  Recommendations: -Fluoroscopy guided LP with normal opening pressure.  Initial labs are reassuring. -Spoke with ophthalmologist-Dr. Talbert Forest, who favors getting a biopsy and giving pulse steroids. -I will start her on Solu-Medrol 1000 mg daily for 3 days -Request Vascular surgery consultation for temporal artery biopsy -After the 3 days of Solu-Medrol, prednisone 50 daily until the biopsy is done and she sees ophthalmology as outpatient. Plan d/w pt, daughter and hospitalist Dr. Venetia Constable.  -- Amie Portland, MD Neurologist [facility] Pager: [phone]

## 2022-07-27 NOTE — Assessment & Plan Note (Signed)
Cont home BP meds ?

## 2022-07-28 ENCOUNTER — Inpatient Hospital Stay (HOSPITAL_COMMUNITY): Payer: Medicare Other | Admitting: Anesthesiology

## 2022-07-28 ENCOUNTER — Encounter (HOSPITAL_COMMUNITY)
Admission: EM | Disposition: A | Discharge status: Home / Self Care | Payer: Self-pay | Source: Home / Self Care | Attending: Internal Medicine

## 2022-07-28 ENCOUNTER — Other Ambulatory Visit: Payer: Self-pay

## 2022-07-28 ENCOUNTER — Encounter (HOSPITAL_COMMUNITY): Payer: Self-pay | Admitting: Internal Medicine

## 2022-07-28 DIAGNOSIS — H4710 Unspecified papilledema: Secondary | ICD-10-CM | POA: Diagnosis present

## 2022-07-28 DIAGNOSIS — M47816 Spondylosis without myelopathy or radiculopathy, lumbar region: Secondary | ICD-10-CM | POA: Diagnosis present

## 2022-07-28 DIAGNOSIS — M316 Other giant cell arteritis: Secondary | ICD-10-CM

## 2022-07-28 DIAGNOSIS — E039 Hypothyroidism, unspecified: Secondary | ICD-10-CM | POA: Diagnosis present

## 2022-07-28 DIAGNOSIS — Z8249 Family history of ischemic heart disease and other diseases of the circulatory system: Secondary | ICD-10-CM | POA: Diagnosis not present

## 2022-07-28 DIAGNOSIS — Z79890 Hormone replacement therapy (postmenopausal): Secondary | ICD-10-CM | POA: Diagnosis not present

## 2022-07-28 DIAGNOSIS — H547 Unspecified visual loss: Secondary | ICD-10-CM

## 2022-07-28 DIAGNOSIS — Z83438 Family history of other disorder of lipoprotein metabolism and other lipidemia: Secondary | ICD-10-CM | POA: Diagnosis not present

## 2022-07-28 DIAGNOSIS — Z87891 Personal history of nicotine dependence: Secondary | ICD-10-CM | POA: Diagnosis not present

## 2022-07-28 DIAGNOSIS — E7800 Pure hypercholesterolemia, unspecified: Secondary | ICD-10-CM | POA: Diagnosis present

## 2022-07-28 DIAGNOSIS — H543 Unqualified visual loss, both eyes: Secondary | ICD-10-CM | POA: Diagnosis present

## 2022-07-28 DIAGNOSIS — Z9049 Acquired absence of other specified parts of digestive tract: Secondary | ICD-10-CM | POA: Diagnosis not present

## 2022-07-28 DIAGNOSIS — H539 Unspecified visual disturbance: Secondary | ICD-10-CM | POA: Diagnosis not present

## 2022-07-28 DIAGNOSIS — I10 Essential (primary) hypertension: Secondary | ICD-10-CM | POA: Diagnosis present

## 2022-07-28 DIAGNOSIS — Z885 Allergy status to narcotic agent status: Secondary | ICD-10-CM | POA: Diagnosis not present

## 2022-07-28 DIAGNOSIS — Z79899 Other long term (current) drug therapy: Secondary | ICD-10-CM | POA: Diagnosis not present

## 2022-07-28 HISTORY — PX: ARTERY BIOPSY: SHX891

## 2022-07-28 LAB — ENA+DNA/DS+ANTICH+CENTRO+JO...
Anti JO-1: <0.2 AI (ref 0.0–0.9)
Centromere Ab Screen: <0.2 AI (ref 0.0–0.9)
Chromatin Ab SerPl-aCnc: <0.2 AI (ref 0.0–0.9)
ENA SM Ab Ser-aCnc: <0.2 AI (ref 0.0–0.9)
Ribonucleic Protein: 0.4 AI (ref 0.0–0.9)
SSA (Ro) (ENA) Antibody, IgG: >8 AI — ABNORMAL HIGH (ref 0.0–0.9)
SSB (La) (ENA) Antibody, IgG: <0.2 AI (ref 0.0–0.9)
Scleroderma (Scl-70) (ENA) Antibody, IgG: <0.2 AI (ref 0.0–0.9)
ds DNA Ab: <1 IU/mL (ref 0–9)

## 2022-07-28 LAB — GLUCOSE, CAPILLARY
Glucose-Capillary: 150 mg/dL — ABNORMAL HIGH (ref 70–99)
Glucose-Capillary: 165 mg/dL — ABNORMAL HIGH (ref 70–99)

## 2022-07-28 LAB — ANA W/REFLEX IF POSITIVE: Anti Nuclear Antibody (ANA): POSITIVE — AB

## 2022-07-28 LAB — CYCLIC CITRUL PEPTIDE ANTIBODY, IGG/IGA: CCP Antibodies IgG/IgA: 5 units (ref 0–19)

## 2022-07-28 SURGERY — BIOPSY TEMPORAL ARTERY
Anesthesia: General | Site: Head | Laterality: Left

## 2022-07-28 MED ORDER — OXYCODONE HCL 5 MG PO TABS
5.0000 mg | ORAL_TABLET | ORAL | Status: DC | PRN
  Administered 2022-07-29: 5 mg via ORAL
  Filled 2022-07-28: qty 1

## 2022-07-28 MED ORDER — EPHEDRINE SULFATE-NACL 50-0.9 MG/10ML-% IV SOSY
PREFILLED_SYRINGE | INTRAVENOUS | Status: DC | PRN
  Administered 2022-07-28 (×3): 5 mg via INTRAVENOUS

## 2022-07-28 MED ORDER — CHLORHEXIDINE GLUCONATE 0.12 % MT SOLN: 15.0000 mL | Freq: Once | OROMUCOSAL | Status: AC

## 2022-07-28 MED ORDER — MIDAZOLAM HCL 2 MG/2ML IJ SOLN
INTRAMUSCULAR | Status: DC | PRN
  Administered 2022-07-28: 2 mg via INTRAVENOUS

## 2022-07-28 MED ORDER — ONDANSETRON HCL 4 MG/2ML IJ SOLN
INTRAMUSCULAR | Status: DC | PRN
  Administered 2022-07-28: 4 mg via INTRAVENOUS

## 2022-07-28 MED ORDER — LIDOCAINE 2% (20 MG/ML) 5 ML SYRINGE
INTRAMUSCULAR | Status: DC | PRN
  Administered 2022-07-28: 100 mg via INTRAVENOUS

## 2022-07-28 MED ORDER — OXYCODONE HCL 5 MG/5ML PO SOLN: 5.0000 mg | Freq: Once | ORAL | Status: DC | PRN

## 2022-07-28 MED ORDER — AMISULPRIDE (ANTIEMETIC) 5 MG/2ML IV SOLN: 10.0000 mg | Freq: Once | INTRAVENOUS | Status: DC | PRN

## 2022-07-28 MED ORDER — PROPOFOL 10 MG/ML IV BOLUS
INTRAVENOUS | Status: DC | PRN
  Administered 2022-07-28: 130 mg via INTRAVENOUS
  Administered 2022-07-28: 10 mg via INTRAVENOUS

## 2022-07-28 MED ORDER — ACETAMINOPHEN 325 MG PO TABS
650.0000 mg | ORAL_TABLET | Freq: Four times a day (QID) | ORAL | Status: DC
  Administered 2022-07-28 – 2022-07-30 (×6): 650 mg via ORAL
  Filled 2022-07-28 (×8): qty 2

## 2022-07-28 MED ORDER — OXYCODONE HCL 5 MG PO TABS: 5.0000 mg | ORAL_TABLET | Freq: Once | ORAL | Status: DC | PRN

## 2022-07-28 MED ORDER — LIDOCAINE HCL (PF) 1 % IJ SOLN
INTRAMUSCULAR | Status: AC
  Filled 2022-07-28: qty 30

## 2022-07-28 MED ORDER — MORPHINE SULFATE (PF) 4 MG/ML IV SOLN: 4.0000 mg | INTRAVENOUS | Status: DC | PRN

## 2022-07-28 MED ORDER — ONDANSETRON HCL 4 MG/2ML IJ SOLN
INTRAMUSCULAR | Status: AC
  Filled 2022-07-28: qty 2

## 2022-07-28 MED ORDER — EPHEDRINE 5 MG/ML INJ
INTRAVENOUS | Status: AC
  Filled 2022-07-28: qty 10

## 2022-07-28 MED ORDER — LIDOCAINE HCL (PF) 1 % IJ SOLN
INTRAMUSCULAR | Status: DC | PRN
  Administered 2022-07-28: 2.5 mL

## 2022-07-28 MED ORDER — MIDAZOLAM HCL 2 MG/2ML IJ SOLN
INTRAMUSCULAR | Status: AC
  Filled 2022-07-28: qty 2

## 2022-07-28 MED ORDER — FENTANYL CITRATE (PF) 250 MCG/5ML IJ SOLN
INTRAMUSCULAR | Status: AC
  Filled 2022-07-28: qty 5

## 2022-07-28 MED ORDER — FENTANYL CITRATE (PF) 100 MCG/2ML IJ SOLN: 25.0000 ug | INTRAMUSCULAR | Status: DC | PRN

## 2022-07-28 MED ORDER — ORAL CARE MOUTH RINSE: 15.0000 mL | Freq: Once | OROMUCOSAL | Status: AC

## 2022-07-28 MED ORDER — CEFAZOLIN SODIUM-DEXTROSE 2-3 GM-%(50ML) IV SOLR
INTRAVENOUS | Status: DC | PRN
  Administered 2022-07-28: 2 g via INTRAVENOUS

## 2022-07-28 MED ORDER — PROPOFOL 10 MG/ML IV BOLUS
INTRAVENOUS | Status: AC
  Filled 2022-07-28: qty 20

## 2022-07-28 MED ORDER — EPHEDRINE 5 MG/ML INJ
INTRAVENOUS | Status: AC
  Filled 2022-07-28: qty 5

## 2022-07-28 MED ORDER — DEXAMETHASONE SODIUM PHOSPHATE 10 MG/ML IJ SOLN: INTRAMUSCULAR | Status: DC | PRN

## 2022-07-28 MED ORDER — CEFAZOLIN SODIUM 1 G IJ SOLR
INTRAMUSCULAR | Status: AC
  Filled 2022-07-28: qty 20

## 2022-07-28 MED ORDER — PHENYLEPHRINE 80 MCG/ML (10ML) SYRINGE FOR IV PUSH (FOR BLOOD PRESSURE SUPPORT)
PREFILLED_SYRINGE | INTRAVENOUS | Status: DC | PRN
  Administered 2022-07-28 (×6): 80 ug via INTRAVENOUS

## 2022-07-28 MED ORDER — PHENYLEPHRINE 80 MCG/ML (10ML) SYRINGE FOR IV PUSH (FOR BLOOD PRESSURE SUPPORT)
PREFILLED_SYRINGE | INTRAVENOUS | Status: AC
  Filled 2022-07-28: qty 10

## 2022-07-28 MED ORDER — CHLORHEXIDINE GLUCONATE 0.12 % MT SOLN
OROMUCOSAL | Status: AC
  Administered 2022-07-28: 15 mL via OROMUCOSAL
  Filled 2022-07-28: qty 15

## 2022-07-28 MED ORDER — FENTANYL CITRATE (PF) 250 MCG/5ML IJ SOLN
INTRAMUSCULAR | Status: DC | PRN
  Administered 2022-07-28: 50 ug via INTRAVENOUS

## 2022-07-28 MED ORDER — LACTATED RINGERS IV SOLN: INTRAVENOUS | Status: DC

## 2022-07-28 MED ORDER — 0.9 % SODIUM CHLORIDE (POUR BTL) OPTIME
TOPICAL | Status: DC | PRN
  Administered 2022-07-28: 1000 mL

## 2022-07-28 SURGICAL SUPPLY — 55 items
APL PRP STRL LF DISP 70% ISPRP (MISCELLANEOUS) ×1
APL SKNCLS STERI-STRIP NONHPOA (GAUZE/BANDAGES/DRESSINGS) ×1
BAG COUNTER SPONGE SURGICOUNT (BAG) ×1 IMPLANT
BAG SPNG CNTER NS LX DISP (BAG) ×1
BALL CTTN LRG ABS STRL LF (GAUZE/BANDAGES/DRESSINGS) ×1
BENZOIN TINCTURE PRP APPL 2/3 (GAUZE/BANDAGES/DRESSINGS) ×2 IMPLANT
CANISTER SUCT 3000ML PPV (MISCELLANEOUS) ×1 IMPLANT
CHLORAPREP W/TINT 26 (MISCELLANEOUS) ×1 IMPLANT
CLIP LIGATING EXTRA MED SLVR (CLIP) IMPLANT
CLIP LIGATING EXTRA SM BLUE (MISCELLANEOUS) IMPLANT
CLIP RETRACTION 3.0MM CORONARY (MISCELLANEOUS) IMPLANT
CLSR STERI-STRIP ANTIMIC 1/2X4 (GAUZE/BANDAGES/DRESSINGS) IMPLANT
CNTNR URN SCR LID CUP LEK RST (MISCELLANEOUS) ×1 IMPLANT
CONT SPEC 4OZ STRL OR WHT (MISCELLANEOUS) ×1
COTTONBALL LRG STERILE PKG (GAUZE/BANDAGES/DRESSINGS) ×1 IMPLANT
COVER PROBE W GEL 5X96 (DRAPES) IMPLANT
COVER SURGICAL LIGHT HANDLE (MISCELLANEOUS) ×1 IMPLANT
DRAPE OPHTHALMIC 77X100 STRL (CUSTOM PROCEDURE TRAY) ×1 IMPLANT
ELECT NDL BLADE 2-5/6 (NEEDLE) ×1 IMPLANT
ELECT NEEDLE BLADE 2-5/6 (NEEDLE) ×1 IMPLANT
ELECT REM PT RETURN 9FT ADLT (ELECTROSURGICAL) ×1
ELECTRODE REM PT RTRN 9FT ADLT (ELECTROSURGICAL) ×1 IMPLANT
GAUZE 4X4 16PLY ~~LOC~~+RFID DBL (SPONGE) ×1 IMPLANT
GAUZE SPONGE 2X2 STRL 8-PLY (GAUZE/BANDAGES/DRESSINGS) IMPLANT
GEL ULTRASOUND 8.5O AQUASONIC (MISCELLANEOUS) ×1 IMPLANT
GLOVE BIO SURGEON STRL SZ8 (GLOVE) ×1 IMPLANT
GOWN STRL REUS W/ TWL LRG LVL3 (GOWN DISPOSABLE) ×1 IMPLANT
GOWN STRL REUS W/ TWL XL LVL3 (GOWN DISPOSABLE) ×1 IMPLANT
GOWN STRL REUS W/TWL LRG LVL3 (GOWN DISPOSABLE) ×1
GOWN STRL REUS W/TWL XL LVL3 (GOWN DISPOSABLE) ×1
KIT BASIN OR (CUSTOM PROCEDURE TRAY) ×1 IMPLANT
KIT TURNOVER KIT B (KITS) ×1 IMPLANT
LOOP VASCULAR MINI 18 RED (MISCELLANEOUS) ×1
LOOP VESSEL MINI RED (MISCELLANEOUS) ×1 IMPLANT
NDL HYPO 25GX1X1/2 BEV (NEEDLE) ×1 IMPLANT
NEEDLE HYPO 25GX1X1/2 BEV (NEEDLE) ×1 IMPLANT
NS IRRIG 1000ML POUR BTL (IV SOLUTION) ×1 IMPLANT
PACK GENERAL/GYN (CUSTOM PROCEDURE TRAY) ×1 IMPLANT
PAD ARMBOARD 7.5X6 YLW CONV (MISCELLANEOUS) ×2 IMPLANT
SPIKE FLUID TRANSFER (MISCELLANEOUS) ×1 IMPLANT
STRIP CLOSURE SKIN 1/2X4 (GAUZE/BANDAGES/DRESSINGS) ×3 IMPLANT
SUCTION FRAZIER HANDLE 10FR (MISCELLANEOUS) ×1
SUCTION TUBE FRAZIER 10FR DISP (MISCELLANEOUS) ×1 IMPLANT
SUT MNCRL AB 4-0 PS2 18 (SUTURE) ×1 IMPLANT
SUT PROLENE 6 0 BV (SUTURE) IMPLANT
SUT SILK 2 0 (SUTURE) ×1
SUT SILK 2-0 18XBRD TIE 12 (SUTURE) IMPLANT
SUT SILK 3 0 (SUTURE) ×1
SUT SILK 3-0 18XBRD TIE 12 (SUTURE) IMPLANT
SUT VIC AB 3-0 SH 27 (SUTURE) ×1
SUT VIC AB 3-0 SH 27X BRD (SUTURE) ×1 IMPLANT
SYR CONTROL 10ML LL (SYRINGE) ×1 IMPLANT
TOWEL GREEN STERILE (TOWEL DISPOSABLE) ×1 IMPLANT
VASCULAR TIE MINI RED 18IN STL (MISCELLANEOUS) IMPLANT
WATER STERILE IRR 1000ML POUR (IV SOLUTION) ×1 IMPLANT

## 2022-07-28 NOTE — Transfer of Care (Signed)
Immediate Anesthesia Transfer of Care Note  Patient: Angela Church  Procedure(s) Performed: BIOPSY TEMPORAL ARTERY (Left: Head)  Patient Location: PACU  Anesthesia Type:General  Level of Consciousness: awake, alert , and oriented  Airway & Oxygen Therapy: Patient Spontanous Breathing  Post-op Assessment: Report given to RN and Post -op Vital signs reviewed and stable  Post vital signs: Reviewed and stable  Last Vitals:  Vitals Value Taken Time  BP 132/50 07/28/22 1405  Temp    Pulse 85 07/28/22 1409  Resp 18 07/28/22 1409  SpO2 96 % 07/28/22 1409  Vitals shown include unvalidated device data.  Last Pain:  Vitals:   07/28/22 1203  TempSrc: Oral  PainSc: 5          Complications: No notable events documented.

## 2022-07-28 NOTE — Progress Notes (Signed)
[facility] PROGRESS NOTE    Progress Note  Angela Church  IEP:[id number] DOB: May 22, 1951 DOA: 07/26/2022 PCP: Shanon Rosser, PA-C     Brief Narrative:   Angela Church is an 72 y.o. female past medical history significant for hypertension recent cataract surgery bilaterally  3 months ago, was sent from the ophthalmologist office for bilateral papilledema, went to the ophthalmologist for decreased visual acuity: Neurology was consulted recommended LP for opening pressure, cell count protein glucose culture and Gram stain, they also recommended a legal, banding IgG index and mL and a meningitis and encephalitis PCR panel.  ED attempted LP which was unsuccessful.  IR was consulted for fluoroscopy.  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 She has an elevated ESR at 103 with generalized fatigue and new onset of hip pain. RPR and  HIV WERE NONREACTIVE ANA unremarkable, and MOG pending. Neurology recommended 3 days of high-dose steroids.  She will need to follow-up with neurology in 8 to 12 weeks Followed by prednisone 50 mg daily and follow-up with ophthalmology as an outpatient. Vascular surgery was consulted for temporal artery biopsy today 07/28/2022. She relates her eyesight is improved.  Hyperlipidemia: Continue statins.  Essential hypertension: Continue on Lasix and lisinopril blood pressure seems to be relatively stable.   DVT prophylaxis: lovenox Family Communication:none Status is: Observation The patient remains OBS appropriate and will d/c before 2 midnights.    Code Status:     Code Status Orders  (From admission, onward)           Start     Ordered   07/27/22 0428  Full code  Continuous        Question:  By:  Answer:  Other   07/27/22 0429           Code Status History     This patient has a current code status but no historical code status.         IV Access:   Peripheral IV   Procedures and diagnostic studies:   DG FL GUIDED LUMBAR PUNCTURE  Result Date: 07/27/2022 CLINICAL DATA:  Patient with recently noted bilateral papilledema by ophthalmology, admitted yesterday for further evaluation. Bedside lumbar puncture attempts were not successful and request has been made for image guided lumbar puncture in radiology. EXAM: LUMBAR PUNCTURE UNDER FLUOROSCOPY PROCEDURE: An appropriate skin entry site was determined fluoroscopically. Operator donned sterile gloves and mask. Skin site was marked, then prepped with Betadine, draped in usual sterile fashion, and infiltrated locally with 1% lidocaine. Initial attempt was made at L3-4 with a 20G spinal needle without return of CSF. Second attempt was made at L5-S1 with a 20G spinal needle without return of CSF. Final attempt was made L2-3 with a 20G needle by Dr. Lin Landsman with successful return of CSF. Clear colorless CSF spontaneously returned, with opening pressure of 14 cm water. 16 ml CSF were collected and divided among 4 sterile vials for the requested laboratory studies. The needle was then removed. The patient tolerated the procedure well and there were no complications. FLUOROSCOPY: Radiation Exposure Index (as provided by the fluoroscopic device): 13.30 mGy Kerma IMPRESSION: Technically successful lumbar puncture under fluoroscopy. This exam was performed by Candiss Norse, PA-C, and was supervised and interpreted by Richardean Sale, MD. Electronically  Signed   By: Richardean Sale M.D.   On: 07/27/2022 14:15   MR BRAIN W WO CONTRAST  Result Date: 07/26/2022 CLINICAL DATA:  Initial evaluation for optic neuritis. EXAM: MRI HEAD AND ORBITS WITHOUT AND WITH CONTRAST TECHNIQUE: Multiplanar, multiecho pulse sequences of the brain  and surrounding structures were obtained without and with intravenous contrast. Multiplanar, multiecho pulse sequences of the orbits and surrounding structures were obtained including fat saturation techniques, before and after intravenous contrast administration. CONTRAST:  7.71m GADAVIST GADOBUTROL 1 MMOL/ML IV SOLN COMPARISON:  None Available. FINDINGS: MRI HEAD FINDINGS Brain: Cerebral volume within normal limits for age. Scattered patchy T2/FLAIR hyperintensity involving the periventricular, deep, and subcortical white matter both cerebral hemispheres, most characteristic of chronic microvascular ischemic disease, mild in nature. Probable tiny remote lacunar infarct noted at the left lentiform nucleus. Small remote lacunar infarct present at the right frontal corona radiata. No abnormal foci of restricted diffusion to suggest acute or subacute ischemia. Gray-white matter differentiation maintained. No areas of chronic cortical infarction. No acute or chronic intracranial blood products. No mass lesion, midline shift or mass effect. No hydrocephalus or extra-axial fluid collection. Partially empty sella noted. No abnormal enhancement. Vascular: Major intracranial vascular flow voids are well maintained. Skull and upper cervical spine: Craniocervical junction within normal limits. Bone marrow signal intensity normal. No scalp soft tissue abnormality. Other: No mastoid effusion. MRI ORBITS FINDINGS Orbits: Globes are symmetric in size with normal appearance and morphology. Sequelae of prior bilateral ocular lens replacement. Optic nerves symmetric and normal in appearance. No abnormal intrinsic optic nerve edema or enhancement to suggest acute optic neuritis. No abnormality about either optic nerve sheath complex or evidence for acute Peri neuritis. Intraconal and extraconal fat maintained. Extra-ocular muscles symmetric and normal. Lacrimal glands normal. No abnormality about the orbital apices or cavernous  sinus. Superior orbital veins grossly symmetric and within normal limits. Visualized sinuses: Mild scattered mucosal thickening present about the ethmoidal air cells. Visualized paranasal sinuses are otherwise clear. Soft tissues: Unremarkable. IMPRESSION: MRI HEAD IMPRESSION: 1. No acute intracranial abnormality. 2. 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 MRI ORBITS IMPRESSION: Normal MRI of the orbits. No evidence for acute optic neuritis. Electronically Signed   By: BJeannine BogaM.D.   On: 07/26/2022 19:23   MR ORBITS W WO CONTRAST  Result Date: 07/26/2022 CLINICAL DATA:  Initial evaluation for optic neuritis. EXAM: MRI HEAD AND ORBITS WITHOUT AND WITH CONTRAST TECHNIQUE: Multiplanar, multiecho pulse sequences of the brain and surrounding structures were obtained without and with intravenous contrast. Multiplanar, multiecho pulse sequences of the orbits and surrounding structures were obtained including fat saturation techniques, before and after intravenous contrast administration. CONTRAST:  7.592mGADAVIST GADOBUTROL 1 MMOL/ML IV SOLN COMPARISON:  None Available. FINDINGS: MRI HEAD FINDINGS Brain: Cerebral volume within normal limits for age. Scattered patchy T2/FLAIR hyperintensity involving the periventricular, deep, and subcortical white matter both cerebral hemispheres, most characteristic of chronic microvascular ischemic disease, mild in nature. Probable tiny remote lacunar infarct noted at the left lentiform nucleus. Small remote lacunar infarct present at the right frontal corona radiata. No abnormal foci of restricted diffusion to suggest acute or subacute ischemia. Gray-white matter differentiation maintained. No areas of chronic cortical infarction. No acute or chronic intracranial blood products. No mass lesion, midline shift or mass effect. No hydrocephalus or extra-axial fluid  collection. Partially empty sella noted. No abnormal enhancement. Vascular: Major intracranial vascular flow voids  are well maintained. Skull and upper cervical spine: Craniocervical junction within normal limits. Bone marrow signal intensity normal. No scalp soft tissue abnormality. Other: No mastoid effusion. MRI ORBITS FINDINGS Orbits: Globes are symmetric in size with normal appearance and morphology. Sequelae of prior bilateral ocular lens replacement. Optic nerves symmetric and normal in appearance. No abnormal intrinsic optic nerve edema or enhancement to suggest acute optic neuritis. No abnormality about either optic nerve sheath complex or evidence for acute Peri neuritis. Intraconal and extraconal fat maintained. Extra-ocular muscles symmetric and normal. Lacrimal glands normal. No abnormality about the orbital apices or cavernous sinus. Superior orbital veins grossly symmetric and within normal limits. Visualized sinuses: Mild scattered mucosal thickening present about the ethmoidal air cells. Visualized paranasal sinuses are otherwise clear. Soft tissues: Unremarkable. IMPRESSION: MRI HEAD IMPRESSION: 1. No acute intracranial abnormality. 2. 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 MRI ORBITS IMPRESSION: Normal MRI of the orbits. No evidence for acute optic neuritis. Electronically Signed   By: Jeannine Boga M.D.   On: 07/26/2022 19:23     Medical Consultants:   None.   Subjective:    Angela Church relates that she can tell the difference that her eyesight is improved today  Objective:    Vitals:   07/27/22 1640 07/27/22 2030 07/28/22 0506 07/28/22 0823  BP: (!) 161/65 (!) 172/60 (!) 155/71 (!) 168/69  Pulse: 67 90 62 64  Resp: 17  17   Temp: 98.3 F (36.8 C)  97.9 F (36.6 C) 97.9 F (36.6 C)  TempSrc: Oral  Oral Oral  SpO2: 100% 99% 100% 98%  Weight:       Height:       SpO2: 98 %   Intake/Output Summary (Last 24 hours) at 07/28/2022 1004 Last data filed at 07/28/2022 0200 Gross per 24 hour  Intake 66 ml  Output --  Net 66 ml   Filed Weights   07/26/22 1528  Weight: 72.1 kg    Exam: General exam: In no acute distress. Respiratory system: Good air movement and clear to auscultation. Cardiovascular system: S1 & S2 heard, RRR. No JVD. Gastrointestinal system: Abdomen is nondistended, soft and nontender.  Extremities: No pedal edema. Skin: No rashes, lesions or ulcers Psychiatry: Judgement and insight appear normal. Mood & affect appropriate.   Data Reviewed:    Labs: Basic Metabolic Panel: Recent Labs  Lab 07/26/22 1535  NA 133*  K 4.7  CL 100  CO2 26  GLUCOSE 90  BUN 25*  CREATININE 0.87  CALCIUM 8.4*    GFR Estimated Creatinine Clearance: 58.6 mL/min (by C-G formula based on SCr of 0.87 mg/dL). Liver Function Tests: Recent Labs  Lab 07/26/22 1535  AST 25  ALT 20  ALKPHOS 45  BILITOT 0.4  PROT 5.6*  ALBUMIN 2.0*    No results for input(s): "LIPASE", "AMYLASE" in the last 168 hours. No results for input(s): "AMMONIA" in the last 168 hours. Coagulation profile No results for input(s): "INR", "PROTIME" in the last 168 hours. COVID-19 Labs  Recent Labs    07/26/22 1535  CRP <0.5     No results found for: "SARSCOV2NAA"  CBC: Recent Labs  Lab 07/26/22 1535  WBC 5.5  NEUTROABS 3.1  HGB 12.0  HCT 35.5*  MCV 93.4  PLT 297    Cardiac Enzymes: No results for input(s): "CKTOTAL", "CKMB", "CKMBINDEX", "TROPONINI" in the last 168 hours. BNP (last 3 results) No results  for input(s): "PROBNP" in the last 8760 hours. CBG: No results for input(s): "GLUCAP" in the last 168 hours. D-Dimer: No results for input(s): "DDIMER" in the last 72 hours. Hgb A1c: No results for input(s): "HGBA1C" in the last 72 hours. Lipid Profile: No results for input(s): "CHOL", "HDL", "LDLCALC", "TRIG", "CHOLHDL",  "LDLDIRECT" in the last 72 hours. Thyroid function studies: Recent Labs    07/27/22 0452  TSH 5.090*    Anemia work up: No results for input(s): "VITAMINB12", "FOLATE", "FERRITIN", "TIBC", "IRON", "RETICCTPCT" in the last 72 hours. Sepsis Labs: Recent Labs  Lab 07/26/22 1535  WBC 5.5    Microbiology Recent Results (from the past 240 hour(s))  CSF culture w Gram Stain     Status: None (Preliminary result)   Collection Time: 07/27/22  1:53 PM   Specimen: PATH Cytology CSF; Cerebrospinal Fluid  Result Value Ref Range Status   Specimen Description CSF  Final   Special Requests NONE  Final   Gram Stain NO WBC SEEN NO ORGANISMS SEEN CYTOSPIN SMEAR   Final   Culture   Final    NO GROWTH < 24 HOURS Performed at [facility], [address], [place], [place] [address]    Report Status PENDING  Incomplete  Anaerobic culture w Gram Stain     Status: None (Preliminary result)   Collection Time: 07/27/22  1:53 PM   Specimen: PATH Cytology CSF; Cerebrospinal Fluid  Result Value Ref Range Status   Specimen Description CSF  Final   Special Requests NONE  Final   Gram Stain   Final    NO WBC SEEN NO ORGANISMS SEEN CYTOSPIN SMEAR Performed at [facility], [place] [address], [place], [place] [address]    Culture PENDING  Incomplete   Report Status PENDING  Incomplete     Medications:    furosemide  20 mg Oral Daily   levothyroxine  50 mcg Oral QAC breakfast   lisinopril  20 mg Oral Daily   rosuvastatin  20 mg Oral Daily   Continuous Infusions:  methylPREDNISolone (SOLU-MEDROL) injection Stopped (07/27/22 2152)      LOS: 0 days   Charlynne Cousins  [facility]  07/28/2022, 10:04 AM

## 2022-07-28 NOTE — Progress Notes (Signed)
[facility] [place] PROGRESS NOTE  ASSESSMENT / PLAN: Angela Church is a 72 y.o. female with possible temporal arteritis. Plan temporal artery biopsy today.   SUBJECTIVE: No complaints. All questions answered.  OBJECTIVE: BP (!) 137/96   Pulse 63   Temp 97.9 F (36.6 C) (Oral)   Resp 16   Ht '5\' 4"'$  (1.626 m)   Wt 72.1 kg   SpO2 93%   BMI 27.28 kg/m   Intake/Output Summary (Last 24 hours) at 07/28/2022 1235 Last data filed at 07/28/2022 0200 Gross per 24 hour  Intake 66 ml  Output --  Net 66 ml    No distress Weak L temporal pulse     Latest Ref Rng & Units 07/26/2022    3:35 PM 04/14/2020    2:52 PM  CBC  WBC 4.0 - 10.5 K/uL 5.5  5.5   Hemoglobin 12.0 - 15.0 g/dL 12.0  14.6   Hematocrit 36.0 - 46.0 % 35.5  42.1   Platelets 150 - 400 K/uL 297          Latest Ref Rng & Units 07/26/2022    3:35 PM 04/14/2020    2:52 PM  CMP  Glucose 70 - 99 mg/dL 90  104   BUN 8 - 23 mg/dL 25  14   Creatinine 0.44 - 1.00 mg/dL 0.87  0.63   Sodium 135 - 145 mmol/L 133  138   Potassium 3.5 - 5.1 mmol/L 4.7  5.1   Chloride 98 - 111 mmol/L 100  102   CO2 22 - 32 mmol/L 26  25   Calcium 8.9 - 10.3 mg/dL 8.4  9.5   Total Protein 6.5 - 8.1 g/dL 5.6  5.8   Total Bilirubin 0.3 - 1.2 mg/dL 0.4  0.3   Alkaline Phos 38 - 126 U/L 45  66   AST 15 - 41 U/L 25  21   ALT 0 - 44 U/L 20  18     Estimated Creatinine Clearance: 58.6 mL/min (by C-G formula based on SCr of 0.87 mg/dL).  Yevonne Aline. Stanford Breed, MD [facility] Phone Number: [phone] 07/28/2022 12:35 PM

## 2022-07-28 NOTE — Progress Notes (Signed)
Patient is back on the floor after the procedure at 144 0PM.

## 2022-07-28 NOTE — Progress Notes (Signed)
Neurology Progress Note   S:// Seen and examined.  No acute issues.  Sleeping comfortably in bed.   O:// Current vital signs: BP (!) 168/69 (BP Location: Right Arm)   Pulse 64   Temp 97.9 F (36.6 C) (Oral)   Resp 17   Ht '5\' 4"'$  (1.626 m)   Wt 72.1 kg   SpO2 98%   BMI 27.28 kg/m  Vital signs in last 24 hours: Temp:  [97.9 F (36.6 C)-98.4 F (36.9 C)] 97.9 F (36.6 C) (02/08 0823) Pulse Rate:  [62-90] 64 (02/08 0823) Resp:  [16-17] 17 (02/08 0506) BP: (155-172)/(58-71) 168/69 (02/08 0823) SpO2:  [98 %-100 %] 98 % (02/08 0823) General: Awake alert in no distress HEENT: No Solik dermatic Lungs are clear Neuroexam Awake alert oriented x 3, edentulous, no dysarthria, no aphasia, pupils equal round reactive light, visual acuity remains the same as yesterday-reduced bilaterally, no restriction of extraocular movement, facial sensation and strength intact, tongue and palate midline.  Motor examination with normal bulk and tone and normal strength in all extremities.  Sensation intact.  No coordination deficits noted.  Medications  Current Facility-Administered Medications:    acetaminophen (TYLENOL) tablet 650 mg, 650 mg, Oral, Q6H PRN **OR** acetaminophen (TYLENOL) suppository 650 mg, 650 mg, Rectal, Q6H PRN, Alcario Drought, Jared M, DO   furosemide (LASIX) tablet 20 mg, 20 mg, Oral, Daily, Alcario Drought, Jared M, DO   levothyroxine (SYNTHROID) tablet 50 mcg, 50 mcg, Oral, QAC breakfast, Alcario Drought, Jared M, DO   lisinopril (ZESTRIL) tablet 20 mg, 20 mg, Oral, Daily, Alcario Drought, Jared M, DO   methylPREDNISolone sodium succinate (SOLU-MEDROL) 1,000 mg in sodium chloride 0.9 % 50 mL IVPB, 1,000 mg, Intravenous, Q24H, Amie Portland, MD, Stopped at 07/27/22 2152   ondansetron (ZOFRAN) tablet 4 mg, 4 mg, Oral, Q6H PRN **OR** ondansetron (ZOFRAN) injection 4 mg, 4 mg, Intravenous, Q6H PRN, Jennette Kettle M, DO   rosuvastatin (CRESTOR) tablet 20 mg, 20 mg, Oral, Daily, Jennette Kettle M, DO, 20 mg at  07/27/22 1642 Labs CBC    Component Value Date/Time   WBC 5.5 07/26/2022 1535   RBC 3.80 (L) 07/26/2022 1535   HGB 12.0 07/26/2022 1535   HGB 14.6 04/14/2020 1452   HCT 35.5 (L) 07/26/2022 1535   HCT 42.1 04/14/2020 1452   PLT 297 07/26/2022 1535   MCV 93.4 07/26/2022 1535   MCV 94 04/14/2020 1452   MCH 31.6 07/26/2022 1535   MCHC 33.8 07/26/2022 1535   RDW 12.6 07/26/2022 1535   RDW 11.8 04/14/2020 1452   LYMPHSABS 1.5 07/26/2022 1535   LYMPHSABS 1.6 04/14/2020 1452   MONOABS 0.9 07/26/2022 1535   EOSABS 0.1 07/26/2022 1535   EOSABS 0.1 04/14/2020 1452   BASOSABS 0.0 07/26/2022 1535   BASOSABS 0.0 04/14/2020 1452    CMP     Component Value Date/Time   NA 133 (L) 07/26/2022 1535   NA 138 04/14/2020 1452   K 4.7 07/26/2022 1535   CL 100 07/26/2022 1535   CO2 26 07/26/2022 1535   GLUCOSE 90 07/26/2022 1535   BUN 25 (H) 07/26/2022 1535   BUN 14 04/14/2020 1452   CREATININE 0.87 07/26/2022 1535   CALCIUM 8.4 (L) 07/26/2022 1535   PROT 5.6 (L) 07/26/2022 1535   PROT 5.8 (L) 04/14/2020 1452   ALBUMIN 2.0 (L) 07/26/2022 1535   ALBUMIN 3.8 04/14/2020 1452   AST 25 07/26/2022 1535   ALT 20 07/26/2022 1535   ALKPHOS 45 07/26/2022 1535   BILITOT 0.4 07/26/2022 1535  BILITOT 0.3 04/14/2020 1452   GFRNONAA >60 07/26/2022 1535   GFRAA 107 04/14/2020 1452   Imaging I have reviewed images in epic and the results pertinent to this consultation are: Imaging reviewed yesterday-please see the progress note from yesterday  Assessment: 72 year old man past history of hypertension hyperlipidemia with 1 month of progressive visual loss after cataract surgery transferred to the ER from the ophthalmology office for bilateral papilledema evaluation.  MRI brain with and without contrast is normal.  No evidence of dural venous sinus thrombus.  Spinal tap with normal opening pressure.  CSF studies are bland. CRP normal but ESR is elevated at 103. 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 Continue Solu-Medrol IV 1000 mg x 3 days total followed by prednisone 50 mg p.o. daily till ophthalmology follow-up and further recommendations per outpatient ophthalmology follow-up. Outpatient neurology follow-up with [facility] in 8 to 12 weeks if desired by the ophthalmologist. Neurology will peripherally follow the patient. Please call with questions Plan was relayed to Dr. Venetia Constable   -- Amie Portland, MD Neurologist [facility] Pager: [phone]

## 2022-07-28 NOTE — Anesthesia Preprocedure Evaluation (Addendum)
Anesthesia Evaluation  Patient identified by MRN, date of birth, ID band Patient awake    Reviewed: Allergy & Precautions, NPO status , Patient's Chart, lab work & pertinent test results  History of Anesthesia Complications Negative for: history of anesthetic complications  Airway Mallampati: II  TM Distance: >3 FB Neck ROM: Full    Dental  (+) Edentulous Lower, Edentulous Upper   Pulmonary former smoker   Pulmonary exam normal        Cardiovascular hypertension, Pt. on medications Normal cardiovascular exam     Neuro/Psych negative neurological ROS  negative psych ROS   GI/Hepatic negative GI ROS, Neg liver ROS,,,  Endo/Other  negative endocrine ROS    Renal/GU negative Renal ROS  negative genitourinary   Musculoskeletal negative musculoskeletal ROS (+)    Abdominal   Peds  Hematology negative hematology ROS (+)   Anesthesia Other Findings Temporal arteritis  Reproductive/Obstetrics negative OB ROS                             Anesthesia Physical Anesthesia Plan  ASA: 2  Anesthesia Plan: General   Post-op Pain Management: Tylenol PO (pre-op)*   Induction: Intravenous  PONV Risk Score and Plan: 3 and Treatment may vary due to age or medical condition, Ondansetron, Dexamethasone and Midazolam  Airway Management Planned: LMA  Additional Equipment: None  Intra-op Plan:   Post-operative Plan: Extubation in OR  Informed Consent: I have reviewed the patients History and Physical, chart, labs and discussed the procedure including the risks, benefits and alternatives for the proposed anesthesia with the patient or authorized representative who has indicated his/her understanding and acceptance.     Dental advisory given  Plan Discussed with: CRNA  Anesthesia Plan Comments:        Anesthesia Quick Evaluation

## 2022-07-28 NOTE — Anesthesia Postprocedure Evaluation (Signed)
Anesthesia Post Note  Patient: Angela Church  Procedure(s) Performed: BIOPSY TEMPORAL ARTERY (Left: Head)     Patient location during evaluation: PACU Anesthesia Type: General Level of consciousness: awake and alert Pain management: pain level controlled Vital Signs Assessment: post-procedure vital signs reviewed and stable Respiratory status: spontaneous breathing, nonlabored ventilation and respiratory function stable Cardiovascular status: blood pressure returned to baseline Postop Assessment: no apparent nausea or vomiting Anesthetic complications: no   No notable events documented.  Last Vitals:  Vitals:   07/28/22 1430 07/28/22 1443  BP: (!) 126/53 (!) 119/54  Pulse: 79 80  Resp: 14   Temp: 37.1 C 36.7 C  SpO2: 95% 99%    Last Pain:  Vitals:   07/28/22 1443  TempSrc: Oral  PainSc:                  Marthenia Rolling

## 2022-07-28 NOTE — Op Note (Signed)
DATE OF SERVICE: 07/28/2022  PATIENT:  Angela Church  72 y.o. female  PRE-OPERATIVE DIAGNOSIS:  possible temporal arteritis  POST-OPERATIVE DIAGNOSIS:  Same  PROCEDURE:   Left temporal artery biopsy  SURGEON:  Surgeon(s) and Role:    * Cherre Robins, MD - Primary  ASSISTANT: none  ANESTHESIA:   general  EBL: minimal  BLOOD ADMINISTERED:none  DRAINS: none   LOCAL MEDICATIONS USED:  LIDOCAINE   SPECIMEN:  left temporal artery segment  COUNTS: confirmed correct .  TOURNIQUET:  none  PATIENT DISPOSITION:  PACU - hemodynamically stable.   Delay start of Pharmacological VTE agent (>24hrs) due to surgical blood loss or risk of bleeding: no  INDICATION FOR PROCEDURE: [name] is a 72 y.o. female with possible temporal arteritis. After careful discussion of risks, benefits, and alternatives the patient was offered left temporal artery biopsy. The patient understood and wished to proceed.  OPERATIVE FINDINGS: left temporal artery grossly unremarkable.  DESCRIPTION OF PROCEDURE: After identification of the patient in the pre-operative holding area, the patient was transferred to the operating room. The patient was positioned supine on the operating room table. Anesthesia was induced. The left temple was prepped and draped in standard fashion. A surgical pause was performed confirming correct patient, procedure, and operative location.  Intraoperative ultrasound was used to map the course of the left temporal artery. A corresponding incision was made over the temporal artery. The incision was carried down through subcutaneous tissue until the temporal artery was found. This was carefully skeletonized. Doppler machine was used to confirm arterial flow in the structure. This signal was lost when the proximal artery was clamped. Satisfied, the specimen was removed. The specimen was excised. The proximal and distal end were ligated with silk suture. Hemostasis was achieved. The wound  was closed in layers using 3-O vicryl and 4-O monocryl. Steri strips were applied.  Upon completion of the case instrument and sharps counts were confirmed correct. The patient was transferred to the PACU in good condition. I was present for all portions of the procedure.  Yevonne Aline. Stanford Breed, MD [facility] Phone Number: [phone] 07/28/2022 2:03 PM

## 2022-07-29 ENCOUNTER — Encounter (HOSPITAL_COMMUNITY): Payer: Self-pay | Admitting: Vascular Surgery

## 2022-07-29 DIAGNOSIS — M316 Other giant cell arteritis: Secondary | ICD-10-CM | POA: Diagnosis not present

## 2022-07-29 DIAGNOSIS — H539 Unspecified visual disturbance: Secondary | ICD-10-CM | POA: Diagnosis not present

## 2022-07-29 LAB — GLUCOSE, CAPILLARY: Glucose-Capillary: 174 mg/dL — ABNORMAL HIGH (ref 70–99)

## 2022-07-29 LAB — IGG CSF INDEX
Albumin CSF-mCnc: 8 mg/dL (ref 8–37)
Albumin: 2.2 g/dL — ABNORMAL LOW (ref 3.9–4.9)
CSF IgG Index: 0.7 (ref 0.0–0.7)
IgG (Immunoglobin G), Serum: 772 mg/dL (ref 586–1602)
IgG, CSF: 2.1 mg/dL (ref 0.0–6.7)
IgG/Alb Ratio, CSF: 0.26 — ABNORMAL HIGH (ref 0.00–0.25)

## 2022-07-29 LAB — HSV 1/2 PCR, CSF
HSV-1 DNA: NEGATIVE
HSV-2 DNA: NEGATIVE

## 2022-07-29 LAB — THYROID ANTIBODIES
Thyroglobulin Antibody: 9.5 IU/mL — ABNORMAL HIGH (ref 0.0–0.9)
Thyroperoxidase Ab SerPl-aCnc: 53 IU/mL — ABNORMAL HIGH (ref 0–34)

## 2022-07-29 LAB — RHEUMATOID FACTOR: Rheumatoid fact SerPl-aCnc: 11.5 IU/mL (ref <14.0)

## 2022-07-29 LAB — NEUROMYELITIS OPTICA AUTOAB, IGG: NMO-IgG: <1.5 U/mL (ref 0.0–3.0)

## 2022-07-29 MED ORDER — POLYETHYLENE GLYCOL 3350 17 G PO PACK
17.0000 g | PACK | Freq: Every day | ORAL | Status: DC
  Administered 2022-07-29 – 2022-07-30 (×2): 17 g via ORAL
  Filled 2022-07-29 (×2): qty 1

## 2022-07-29 MED ORDER — SODIUM CHLORIDE 0.9 % IV SOLN
1000.0000 mg | Freq: Once | INTRAVENOUS | Status: AC
  Administered 2022-07-29: 1000 mg via INTRAVENOUS
  Filled 2022-07-29: qty 16

## 2022-07-29 NOTE — Progress Notes (Signed)
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  A:// -Bilateral papilledema -S/p Temporal artery biopsy -Abnormal SSA, ANA, Anti TPO, Anti thyroglobulin ab  Suspect would need further eval from multispecialty team (rheum, neurology, ophthalmology) outpatient.   Recs: Three days of IV methyprednisone 1g daily-can complete today's dose early for a late evening discharge. Prednisone 50 mg daily on discharge Follow up with ophthalmology Dr. Robert Bellow follow up path report of biopsy Follow up rheumatology as soon as an appt is available Outpatient neurology follow up - [facility] in 4 weeks Discussed in detail with the patient and daughter at bedside  Plan relayed to Dr. Venetia Constable  -- Amie Portland, MD Neurologist [facility] Pager: [phone]

## 2022-07-29 NOTE — Progress Notes (Signed)
[facility] PROGRESS NOTE    Progress Note  Angela Church  [id number] DOB: Apr 01, 1951 DOA: 07/26/2022 PCP: Shanon Rosser, PA-C     Brief Narrative:   Angela Church is an 72 y.o. female past medical history significant for hypertension recent cataract surgery bilaterally  3 months ago, was sent from the ophthalmologist office for bilateral papilledema, went to the ophthalmologist for decreased visual acuity: Neurology was consulted recommended LP for opening pressure, cell count protein glucose culture and Gram stain, they also recommended a legal, banding IgG index and mL and a meningitis and encephalitis PCR panel.  ED attempted LP which was unsuccessful.  IR was consulted for fluoroscopy.  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 She will complete her treatment today. Vascular surgery was consulted she status post temporal artery biopsy on 07/28/2022 results to be follow-up as an outpatient. She relates her eyesight is improved. PT eval is pending.  Hyperlipidemia: Continue statins.  Essential hypertension: Continue on Lasix and lisinopril blood pressure seems to be relatively stable.   DVT prophylaxis: lovenox Family Communication:none Status is: Observation The patient remains OBS appropriate and will d/c before 2 midnights.    Code Status:     Code Status Orders  (From admission, onward)           Start     Ordered   07/27/22 0428  Full code  Continuous       Question:  By:  Answer:  Other   07/27/22 0429           Code Status History     This patient has a current code status but no historical code status.         IV  Access:   Peripheral IV   Procedures and diagnostic studies:   DG FL GUIDED LUMBAR PUNCTURE  Result Date: 07/27/2022 CLINICAL DATA:  Patient with recently noted bilateral papilledema by ophthalmology, admitted yesterday for further evaluation. Bedside lumbar puncture attempts were not successful and request has been made for image guided lumbar puncture in radiology. EXAM: LUMBAR PUNCTURE UNDER FLUOROSCOPY PROCEDURE: An appropriate skin entry site was determined fluoroscopically. Operator donned sterile gloves and mask. Skin site was marked, then prepped with Betadine, draped in usual sterile fashion, and infiltrated locally with 1% lidocaine. Initial attempt was made at L3-4 with a 20G spinal needle without return of CSF. Second attempt was made at L5-S1 with a 20G spinal needle without return of CSF. Final attempt was made L2-3 with a 20G needle by Dr. Lin Landsman with successful return of CSF. Clear colorless CSF spontaneously returned, with opening pressure of 14 cm water. 16 ml CSF were collected and divided among 4 sterile vials for the requested laboratory studies. The needle was then removed. The patient tolerated the procedure well and there were no complications. FLUOROSCOPY: Radiation Exposure Index (as provided by the fluoroscopic device): 13.30 mGy Kerma IMPRESSION: Technically successful lumbar puncture under fluoroscopy. This exam was performed by Candiss Norse, PA-C, and was supervised and interpreted by Richardean Sale, MD. Electronically Signed   By: Richardean Sale M.D.   On: 07/27/2022 14:15     Medical Consultants:   None.   Subjective:    Beckey Church she  relates her eyesight is significantly improved compared to 2 days ago she relates she is think she is close to what she was 2 months ago after the surgery  Objective:    Vitals:   07/29/22 0442 07/29/22 0443 07/29/22 0444 07/29/22 0842  BP: (!) 151/69 (!) 157/69 (!) 157/69 (!) 177/76  Pulse: 70 64 65 64  Resp: 17 17  17 19  $ Temp: 97.6 F (36.4 C) 97.6 F (36.4 C) 97.6 F (36.4 C) 98 F (36.7 C)  TempSrc: Oral Oral Oral   SpO2: 100% 99% 100% 100%  Weight:      Height:       SpO2: 100 %   Intake/Output Summary (Last 24 hours) at 07/29/2022 1026 Last data filed at 07/28/2022 1419 Gross per 24 hour  Intake 890 ml  Output --  Net 890 ml    Filed Weights   07/26/22 1528 07/28/22 1203  Weight: 72.1 kg 72.1 kg    Exam: General exam: In no acute distress. Respiratory system: Good air movement and clear to auscultation. Cardiovascular system: S1 & S2 heard, RRR. No JVD. Gastrointestinal system: Abdomen is nondistended, soft and nontender.  Extremities: No pedal edema. Skin: No rashes, lesions or ulcers Psychiatry: Judgement and insight appear normal. Mood & affect appropriate. Data Reviewed:    Labs: Basic Metabolic Panel: Recent Labs  Lab 07/26/22 1535  NA 133*  K 4.7  CL 100  CO2 26  GLUCOSE 90  BUN 25*  CREATININE 0.87  CALCIUM 8.4*    GFR Estimated Creatinine Clearance: 58.6 mL/min (by C-G formula based on SCr of 0.87 mg/dL). Liver Function Tests: Recent Labs  Lab 07/26/22 1535  AST 25  ALT 20  ALKPHOS 45  BILITOT 0.4  PROT 5.6*  ALBUMIN 2.0*    No results for input(s): "LIPASE", "AMYLASE" in the last 168 hours. No results for input(s): "AMMONIA" in the last 168 hours. Coagulation profile No results for input(s): "INR", "PROTIME" in the last 168 hours. COVID-19 Labs  Recent Labs    07/26/22 1535  CRP <0.5     No results found for: "SARSCOV2NAA"  CBC: Recent Labs  Lab 07/26/22 1535  WBC 5.5  NEUTROABS 3.1  HGB 12.0  HCT 35.5*  MCV 93.4  PLT 297    Cardiac Enzymes: No results for input(s): "CKTOTAL", "CKMB", "CKMBINDEX", "TROPONINI" in the last 168 hours. BNP (last 3 results) No results for input(s): "PROBNP" in the last 8760 hours. CBG: Recent Labs  Lab 07/28/22 1707 07/28/22 2340 07/29/22 0448  GLUCAP 165* 150* 174*   D-Dimer: No  results for input(s): "DDIMER" in the last 72 hours. Hgb A1c: No results for input(s): "HGBA1C" in the last 72 hours. Lipid Profile: No results for input(s): "CHOL", "HDL", "LDLCALC", "TRIG", "CHOLHDL", "LDLDIRECT" in the last 72 hours. Thyroid function studies: Recent Labs    07/27/22 0452  TSH 5.090*    Anemia work up: No results for input(s): "VITAMINB12", "FOLATE", "FERRITIN", "TIBC", "IRON", "RETICCTPCT" in the last 72 hours. Sepsis Labs: Recent Labs  Lab 07/26/22 1535  WBC 5.5    Microbiology Recent Results (from the past 240 hour(s))  CSF culture w Gram Stain     Status: None (Preliminary result)   Collection Time: 07/27/22  1:53 PM   Specimen: PATH Cytology CSF; Cerebrospinal Fluid  Result Value Ref Range Status   Specimen Description CSF  Final   Special Requests NONE  Final   Gram Stain NO WBC SEEN NO ORGANISMS SEEN CYTOSPIN SMEAR  Final   Culture   Final    NO GROWTH 2 DAYS Performed at [facility], [place] [address] [place], [place] [address]    Report Status PENDING  Incomplete  Anaerobic culture w Gram Stain     Status: None (Preliminary result)   Collection Time: 07/27/22  1:53 PM   Specimen: PATH Cytology CSF; Cerebrospinal Fluid  Result Value Ref Range Status   Specimen Description CSF  Final   Special Requests NONE  Final   Gram Stain NO WBC SEEN NO ORGANISMS SEEN CYTOSPIN SMEAR   Final   Culture   Final    NO GROWTH NO ANAEROBES ISOLATED; CULTURE IN PROGRESS FOR 5 DAYS Performed at [facility], [place] [address] [place], [place] [address]    Report Status PENDING  Incomplete     Medications:    acetaminophen  650 mg Oral Q6H   furosemide  20 mg Oral Daily   levothyroxine  50 mcg Oral QAC breakfast   lisinopril  20 mg Oral Daily   polyethylene glycol  17 g Oral Daily   rosuvastatin  20 mg Oral Daily   Continuous Infusions:  methylPREDNISolone (SOLU-MEDROL) injection 1,000 mg (07/28/22 2152)      LOS: 1 day   Charlynne Cousins  [facility]  07/29/2022, 10:26 AM

## 2022-07-29 NOTE — Evaluation (Signed)
Physical Therapy Evaluation Patient Details Name: Angela Church MRN: [id number] DOB: 09/01/1950 Today's Date: 07/29/2022  History of Present Illness  72 y.o. female presents to [facility] hospital on 07/26/2022 after referral from ophthalmologist due to decreased visual acuity. MRI negative. Neurology with concern for temporal arteritis. Pt underwent L temporal artery biopsy on 2/8. PMH includes HTN, HLD.  Clinical Impression  Pt presents to PT with no significant functional deficits at this time. Pt is able to mobilize and ambulate independently. Pt reports subjective hip weakness which she has been going to outpatient PT for. Pt has no further acute PT needs at this time. PT recommends return to outpatient PT once cleared for more strenuous activity by neurology (pt reports being told to avoid strenuous activity at this time).      Recommendations for follow up therapy are one component of a multi-disciplinary discharge planning process, led by the attending physician.  Recommendations may be updated based on patient status, additional functional criteria and insurance authorization.  Follow Up Recommendations Outpatient PT (continue outpatient PT for hip)      Assistance Recommended at Discharge None  Patient can return home with the following  Assist for transportation    Equipment Recommendations None recommended by PT  Recommendations for Other Services       Functional Status Assessment Patient has not had a recent decline in their functional status     Precautions / Restrictions Precautions Precautions: None Restrictions Weight Bearing Restrictions: No      Mobility  Bed Mobility Overal bed mobility: Independent                  Transfers Overall transfer level: Independent                      Ambulation/Gait Ambulation/Gait assistance: Independent Gait Distance (Feet): 300 Feet Assistive device: None Gait Pattern/deviations: WFL(Within Functional  Limits) Gait velocity: functional Gait velocity interpretation: 1.31 - 2.62 ft/sec, indicative of limited community ambulator   General Gait Details: steady step-through Development worker, international aid    Modified Rankin (Stroke Patients Only)       Balance Overall balance assessment: Independent                                           Pertinent Vitals/Pain Pain Assessment Pain Assessment: Faces Faces Pain Scale: Hurts little more Pain Location: surgical site Pain Descriptors / Indicators: Sore Pain Intervention(s): Monitored during session    Home Living Family/patient expects to be discharged to:: Private residence Living Arrangements: Alone   Type of Home: Apartment Home Access: Elevator       Home Layout: One level Home Equipment: None      Prior Function Prior Level of Function : Independent/Modified Independent;Driving                     Hand Dominance        Extremity/Trunk Assessment   Upper Extremity Assessment Upper Extremity Assessment: Overall WFL for tasks assessed    Lower Extremity Assessment Lower Extremity Assessment: Overall WFL for tasks assessed    Cervical / Trunk Assessment Cervical / Trunk Assessment: Normal  Communication   Communication: No difficulties  Cognition Arousal/Alertness: Awake/alert Behavior During Therapy: WFL for tasks assessed/performed Overall Cognitive Status: Within Functional Limits for  tasks assessed                                          General Comments General comments (skin integrity, edema, etc.): VSS on RA    Exercises     Assessment/Plan    PT Assessment Patient does not need any further PT services  PT Problem List         PT Treatment Interventions      PT Goals (Current goals can be found in the Care Plan section)       Frequency       Co-evaluation               AM-PAC PT "6 Clicks" Mobility   Outcome Measure Help needed turning from your back to your side while in a flat bed without using bedrails?: None Help needed moving from lying on your back to sitting on the side of a flat bed without using bedrails?: None Help needed moving to and from a bed to a chair (including a wheelchair)?: None Help needed standing up from a chair using your arms (e.g., wheelchair or bedside chair)?: None Help needed to walk in hospital room?: None Help needed climbing 3-5 steps with a railing? : None 6 Click Score: 24    End of Session   Activity Tolerance: Patient tolerated treatment well Patient left: in bed;with call bell/phone within reach Nurse Communication: Mobility status PT Visit Diagnosis: Other symptoms and signs involving the nervous system (R29.898)    Time: 1720-1735 PT Time Calculation (min) (ACUTE ONLY): 15 min   Charges:   PT Evaluation $PT Eval Low Complexity: [place], PT, DPT Acute Rehabilitation Office [phone]   Zenaida Niece 07/29/2022, 5:42 PM

## 2022-07-30 DIAGNOSIS — M316 Other giant cell arteritis: Secondary | ICD-10-CM | POA: Diagnosis not present

## 2022-07-30 LAB — CSF CULTURE W GRAM STAIN
Culture: NO GROWTH
Gram Stain: NONE SEEN

## 2022-07-30 MED ORDER — PREDNISONE 50 MG PO TABS: 50.0000 mg | ORAL_TABLET | Freq: Every day | ORAL | 3 refills | Status: DC

## 2022-07-30 MED ORDER — PREDNISONE 20 MG PO TABS: 50.0000 mg | ORAL_TABLET | Freq: Every day | ORAL | Status: DC

## 2022-07-30 NOTE — Plan of Care (Signed)

## 2022-07-30 NOTE — Discharge Summary (Signed)
Physician Discharge Summary  Angela Church [id number] DOB: 19-Feb-1951 DOA: 07/26/2022  PCP: Shanon Rosser, PA-C  Admit date: 07/26/2022 Discharge date: 07/30/2022  Admitted From: Home Disposition:  Home  Recommendations for Outpatient Follow-up:  Follow up with Neurologyin 1-2 weeks Please obtain BMP/CBC in one week Home Health:Yes Equipment/Devices:None  Discharge Condition:Stable CODE STATUS:Full Diet recommendation: Heart Healthy  Brief/Interim Summary: 72 y.o. female past medical history significant for hypertension recent cataract surgery bilaterally  3 months ago, was sent from the ophthalmologist office for bilateral papilledema, went to the ophthalmologist for decreased visual acuity: Neurology was consulted recommended LP for opening pressure, cell count protein glucose culture and Gram stain, they also recommended a legal, banding IgG index and mL and a meningitis and encephalitis PCR panel.  ED attempted LP which was unsuccessful.  IR was consulted for fluoroscopy.  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 LP was done that showed no signs of infection neurology was consulted who agree with plan she completed 3-day course of IV steroids in house with significant improvement in her eyesight. Vascular surgery was consulted and she status post biopsy of the temporal artery on 07/28/2022. She will follow-up with neurology as an outpatient she will continue prednisone 50 as an outpatient.  Hyperlipidemia: Continue statins.  Central hypertension: No change made to her medication.  Discharge Instructions  Discharge  Instructions     Diet - low sodium heart healthy   Complete by: As directed    Increase activity slowly   Complete by: As directed       Allergies as of 07/30/2022       Reactions   Acetaminophen-codeine Other (See Comments)   "Felt out of control"        Medication List     TAKE these medications    CO Q10 PO Take 1 capsule by mouth daily.   COLLAGEN PO Take 1 capsule by mouth daily.   furosemide 20 MG tablet Commonly known as: LASIX Take 20 mg by mouth daily.   KRILL OIL PO Take 1 capsule by mouth daily.   levothyroxine 50 MCG tablet Commonly known as: SYNTHROID Take 50 mcg by mouth daily before breakfast.   lisinopril 10 MG tablet Commonly known as: ZESTRIL Take 1 tablet (10 mg total) by mouth daily. What changed: how much to take   ONE-A-DAY WOMENS 50 PLUS PO Take 1 tablet by mouth daily.   predniSONE 50 MG tablet Commonly known as: DELTASONE Take 1 tablet (50 mg total) by mouth daily with breakfast. Start taking on: July 31, 2022   rosuvastatin 10 MG tablet Commonly known as: CRESTOR Take 1 tablet (10 mg total) by mouth daily. What changed: how much to take   TURMERIC PO Take 1 tablet by mouth daily.        Allergies  Allergen Reactions   Acetaminophen-Codeine Other (See Comments)    "Felt out of control"    Consultations: Neurology Vascular surgery   Procedures/Studies: DG FL GUIDED LUMBAR PUNCTURE  Result Date: 07/27/2022 CLINICAL DATA:  Patient with recently noted bilateral papilledema by ophthalmology, admitted yesterday for further evaluation. Bedside lumbar puncture attempts were not successful and request has  been made for image guided lumbar puncture in radiology. EXAM: LUMBAR PUNCTURE UNDER FLUOROSCOPY PROCEDURE: An appropriate skin entry site was determined fluoroscopically. Operator donned sterile gloves and mask. Skin site was marked, then prepped with Betadine, draped in usual sterile fashion, and infiltrated locally  with 1% lidocaine. Initial attempt was made at L3-4 with a 20G spinal needle without return of CSF. Second attempt was made at L5-S1 with a 20G spinal needle without return of CSF. Final attempt was made L2-3 with a 20G needle by Dr. Lin Landsman with successful return of CSF. Clear colorless CSF spontaneously returned, with opening pressure of 14 cm water. 16 ml CSF were collected and divided among 4 sterile vials for the requested laboratory studies. The needle was then removed. The patient tolerated the procedure well and there were no complications. FLUOROSCOPY: Radiation Exposure Index (as provided by the fluoroscopic device): 13.30 mGy Kerma IMPRESSION: Technically successful lumbar puncture under fluoroscopy. This exam was performed by Candiss Norse, PA-C, and was supervised and interpreted by Richardean Sale, MD. Electronically Signed   By: Richardean Sale M.D.   On: 07/27/2022 14:15   MR BRAIN W WO CONTRAST  Result Date: 07/26/2022 CLINICAL DATA:  Initial evaluation for optic neuritis. EXAM: MRI HEAD AND ORBITS WITHOUT AND WITH CONTRAST TECHNIQUE: Multiplanar, multiecho pulse sequences of the brain and surrounding structures were obtained without and with intravenous contrast. Multiplanar, multiecho pulse sequences of the orbits and surrounding structures were obtained including fat saturation techniques, before and after intravenous contrast administration. CONTRAST:  7.62m GADAVIST GADOBUTROL 1 MMOL/ML IV SOLN COMPARISON:  None Available. FINDINGS: MRI HEAD FINDINGS Brain: Cerebral volume within normal limits for age. Scattered patchy T2/FLAIR hyperintensity involving the periventricular, deep, and subcortical white matter both cerebral hemispheres, most characteristic of chronic microvascular ischemic disease, mild in nature. Probable tiny remote lacunar infarct noted at the left lentiform nucleus. Small remote lacunar infarct present at the right frontal corona radiata. No abnormal foci of restricted  diffusion to suggest acute or subacute ischemia. Gray-white matter differentiation maintained. No areas of chronic cortical infarction. No acute or chronic intracranial blood products. No mass lesion, midline shift or mass effect. No hydrocephalus or extra-axial fluid collection. Partially empty sella noted. No abnormal enhancement. Vascular: Major intracranial vascular flow voids are well maintained. Skull and upper cervical spine: Craniocervical junction within normal limits. Bone marrow signal intensity normal. No scalp soft tissue abnormality. Other: No mastoid effusion. MRI ORBITS FINDINGS Orbits: Globes are symmetric in size with normal appearance and morphology. Sequelae of prior bilateral ocular lens replacement. Optic nerves symmetric and normal in appearance. No abnormal intrinsic optic nerve edema or enhancement to suggest acute optic neuritis. No abnormality about either optic nerve sheath complex or evidence for acute Peri neuritis. Intraconal and extraconal fat maintained. Extra-ocular muscles symmetric and normal. Lacrimal glands normal. No abnormality about the orbital apices or cavernous sinus. Superior orbital veins grossly symmetric and within normal limits. Visualized sinuses: Mild scattered mucosal thickening present about the ethmoidal air cells. Visualized paranasal sinuses are otherwise clear. Soft tissues: Unremarkable. IMPRESSION: MRI HEAD IMPRESSION: 1. No acute intracranial abnormality. 2. 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 MRI ORBITS IMPRESSION: Normal MRI of the orbits. No evidence for acute optic neuritis. Electronically Signed   By: BJeannine BogaM.D.   On: 07/26/2022 19:23   MR ORBITS W WO CONTRAST  Result Date: 07/26/2022 CLINICAL DATA:  Initial evaluation for optic neuritis. EXAM:  MRI HEAD AND ORBITS WITHOUT AND WITH CONTRAST TECHNIQUE: Multiplanar, multiecho  pulse sequences of the brain and surrounding structures were obtained without and with intravenous contrast. Multiplanar, multiecho pulse sequences of the orbits and surrounding structures were obtained including fat saturation techniques, before and after intravenous contrast administration. CONTRAST:  7.46m GADAVIST GADOBUTROL 1 MMOL/ML IV SOLN COMPARISON:  None Available. FINDINGS: MRI HEAD FINDINGS Brain: Cerebral volume within normal limits for age. Scattered patchy T2/FLAIR hyperintensity involving the periventricular, deep, and subcortical white matter both cerebral hemispheres, most characteristic of chronic microvascular ischemic disease, mild in nature. Probable tiny remote lacunar infarct noted at the left lentiform nucleus. Small remote lacunar infarct present at the right frontal corona radiata. No abnormal foci of restricted diffusion to suggest acute or subacute ischemia. Gray-white matter differentiation maintained. No areas of chronic cortical infarction. No acute or chronic intracranial blood products. No mass lesion, midline shift or mass effect. No hydrocephalus or extra-axial fluid collection. Partially empty sella noted. No abnormal enhancement. Vascular: Major intracranial vascular flow voids are well maintained. Skull and upper cervical spine: Craniocervical junction within normal limits. Bone marrow signal intensity normal. No scalp soft tissue abnormality. Other: No mastoid effusion. MRI ORBITS FINDINGS Orbits: Globes are symmetric in size with normal appearance and morphology. Sequelae of prior bilateral ocular lens replacement. Optic nerves symmetric and normal in appearance. No abnormal intrinsic optic nerve edema or enhancement to suggest acute optic neuritis. No abnormality about either optic nerve sheath complex or evidence for acute Peri neuritis. Intraconal and extraconal fat maintained. Extra-ocular muscles symmetric and normal. Lacrimal glands normal. No abnormality about the  orbital apices or cavernous sinus. Superior orbital veins grossly symmetric and within normal limits. Visualized sinuses: Mild scattered mucosal thickening present about the ethmoidal air cells. Visualized paranasal sinuses are otherwise clear. Soft tissues: Unremarkable. IMPRESSION: MRI HEAD IMPRESSION: 1. No acute intracranial abnormality. 2. 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 MRI ORBITS IMPRESSION: Normal MRI of the orbits. No evidence for acute optic neuritis. Electronically Signed   By: BJeannine BogaM.D.   On: 07/26/2022 19:23   (Echo, Carotid, EGD, Colonoscopy, ERCP)    Subjective: No complaints  Discharge Exam: Vitals:   07/30/22 0424 07/30/22 0735  BP: (!) 170/65 (!) 171/73  Pulse: 65 66  Resp: 17   Temp: 97.8 F (36.6 C) 97.7 F (36.5 C)  SpO2: 100% 94%   Vitals:   07/29/22 1612 07/29/22 2027 07/30/22 0424 07/30/22 0735  BP: (!) 151/86 (!) 164/69 (!) 170/65 (!) 171/73  Pulse: 64 64 65 66  Resp: 19 17 17   $ Temp: 98.2 F (36.8 C) 98.2 F (36.8 C) 97.8 F (36.6 C) 97.7 F (36.5 C)  TempSrc:  Oral Oral   SpO2: 100% 100% 100% 94%  Weight:      Height:        General: Pt is alert, awake, not in acute distress Cardiovascular: RRR, S1/S2 +, no rubs, no gallops Respiratory: CTA bilaterally, no wheezing, no rhonchi Abdominal: Soft, NT, ND, bowel sounds + Extremities: no edema, no cyanosis    The results of significant diagnostics from this hospitalization (including imaging, microbiology, ancillary and laboratory) are listed below for reference.     Microbiology: Recent Results (from the past 240 hour(s))  CSF culture w Gram Stain     Status: None (Preliminary result)   Collection Time: 07/27/22  1:53 PM   Specimen: PATH Cytology CSF; Cerebrospinal  Fluid  Result Value Ref Range Status   Specimen Description CSF  Final   Special Requests NONE   Final   Gram Stain NO WBC SEEN NO ORGANISMS SEEN CYTOSPIN SMEAR   Final   Culture   Final    NO GROWTH 2 DAYS Performed at [facility], [address], [place], [place] [address]    Report Status PENDING  Incomplete  Anaerobic culture w Gram Stain     Status: None (Preliminary result)   Collection Time: 07/27/22  1:53 PM   Specimen: PATH Cytology CSF; Cerebrospinal Fluid  Result Value Ref Range Status   Specimen Description CSF  Final   Special Requests NONE  Final   Gram Stain NO WBC SEEN NO ORGANISMS SEEN CYTOSPIN SMEAR   Final   Culture   Final    NO GROWTH NO ANAEROBES ISOLATED; CULTURE IN PROGRESS FOR 5 DAYS Performed at [facility], [place] [address], [place], [place] [address]    Report Status PENDING  Incomplete     Labs: BNP (last 3 results) No results for input(s): "BNP" in the last 8760 hours. Basic Metabolic Panel: Recent Labs  Lab 07/26/22 1535  NA 133*  K 4.7  CL 100  CO2 26  GLUCOSE 90  BUN 25*  CREATININE 0.87  CALCIUM 8.4*   Liver Function Tests: Recent Labs  Lab 07/26/22 1535 07/27/22 1353  AST 25  --   ALT 20  --   ALKPHOS 45  --   BILITOT 0.4  --   PROT 5.6*  --   ALBUMIN 2.0* 2.2*   No results for input(s): "LIPASE", "AMYLASE" in the last 168 hours. No results for input(s): "AMMONIA" in the last 168 hours. CBC: Recent Labs  Lab 07/26/22 1535  WBC 5.5  NEUTROABS 3.1  HGB 12.0  HCT 35.5*  MCV 93.4  PLT 297   Cardiac Enzymes: No results for input(s): "CKTOTAL", "CKMB", "CKMBINDEX", "TROPONINI" in the last 168 hours. BNP: Invalid input(s): "POCBNP" CBG: Recent Labs  Lab 07/28/22 1707 07/28/22 2340 07/29/22 0448  GLUCAP 165* 150* 174*   D-Dimer No results for input(s): "DDIMER" in the last 72 hours. Hgb A1c No results for input(s): "HGBA1C" in the last 72 hours. Lipid Profile No results for input(s): "CHOL", "HDL", "LDLCALC", "TRIG", "CHOLHDL", "LDLDIRECT" in the last 72 hours. Thyroid function  studies No results for input(s): "TSH", "T4TOTAL", "T3FREE", "THYROIDAB" in the last 72 hours.  Invalid input(s): "FREET3" Anemia work up No results for input(s): "VITAMINB12", "FOLATE", "FERRITIN", "TIBC", "IRON", "RETICCTPCT" in the last 72 hours. Urinalysis No results found for: "COLORURINE", "APPEARANCEUR", "LABSPEC", "PHURINE", "GLUCOSEU", "HGBUR", "BILIRUBINUR", "KETONESUR", "PROTEINUR", "UROBILINOGEN", "NITRITE", "LEUKOCYTESUR" Sepsis Labs Recent Labs  Lab 07/26/22 1535  WBC 5.5   Microbiology Recent Results (from the past 240 hour(s))  CSF culture w Gram Stain     Status: None (Preliminary result)   Collection Time: 07/27/22  1:53 PM   Specimen: PATH Cytology CSF; Cerebrospinal Fluid  Result Value Ref Range Status   Specimen Description CSF  Final   Special Requests NONE  Final   Gram Stain NO WBC SEEN NO ORGANISMS SEEN CYTOSPIN SMEAR   Final   Culture   Final    NO GROWTH 2 DAYS Performed at [facility], [place] [address], [place], [place] [address]    Report Status PENDING  Incomplete  Anaerobic culture w Gram Stain     Status: None (Preliminary result)   Collection Time: 07/27/22  1:53  PM   Specimen: PATH Cytology CSF; Cerebrospinal Fluid  Result Value Ref Range Status   Specimen Description CSF  Final   Special Requests NONE  Final   Gram Stain NO WBC SEEN NO ORGANISMS SEEN CYTOSPIN SMEAR   Final   Culture   Final    NO GROWTH NO ANAEROBES ISOLATED; CULTURE IN PROGRESS FOR 5 DAYS Performed at [facility], [address], [place], [place] [address]    Report Status PENDING  Incomplete     SIGNED:   Charlynne Cousins, MD  [facility] 07/30/2022, 7:55 AM Pager   If 7PM-7AM, please contact night-coverage [email or web address] Password [email or web address]

## 2022-07-30 NOTE — Progress Notes (Signed)
Patient given discharge instructions and verbalized understanding. PIV x1 removed and patient dressed herself. Daughter is here to take patient home, patient discharged home via wheelchair with family. All belongings with patient.

## 2022-07-31 LAB — ANTIPHOSPHOLIPID SYNDROME EVAL, BLD
Anticardiolipin IgA: <9 APL U/mL (ref 0–11)
Anticardiolipin IgG: <9 GPL U/mL (ref 0–14)
Anticardiolipin IgM: <9 MPL U/mL (ref 0–12)
DRVVT: 33.4 s (ref 0.0–47.0)
PTT Lupus Anticoagulant: 34.8 s (ref 0.0–43.5)
Phosphatydalserine, IgA: 2 APS Units (ref 0–19)
Phosphatydalserine, IgG: 9 Units (ref 0–30)
Phosphatydalserine, IgM: 15 Units (ref 0–30)

## 2022-08-01 LAB — ANAEROBIC CULTURE W GRAM STAIN: Gram Stain: NONE SEEN

## 2022-08-01 LAB — SURGICAL PATHOLOGY

## 2022-08-03 ENCOUNTER — Encounter: Payer: Self-pay | Admitting: Neurology

## 2022-08-03 ENCOUNTER — Telehealth: Payer: Self-pay | Admitting: *Deleted

## 2022-08-03 ENCOUNTER — Ambulatory Visit (INDEPENDENT_AMBULATORY_CARE_PROVIDER_SITE_OTHER): Payer: Federal, State, Local not specified - PPO | Admitting: Neurology

## 2022-08-03 ENCOUNTER — Telehealth: Payer: Self-pay | Admitting: Neurology

## 2022-08-03 VITALS — BP 168/88 | HR 74 | Ht 63.0 in | Wt 168.5 lb

## 2022-08-03 DIAGNOSIS — H4710 Unspecified papilledema: Secondary | ICD-10-CM

## 2022-08-03 DIAGNOSIS — R768 Other specified abnormal immunological findings in serum: Secondary | ICD-10-CM

## 2022-08-03 DIAGNOSIS — H04123 Dry eye syndrome of bilateral lacrimal glands: Secondary | ICD-10-CM | POA: Diagnosis not present

## 2022-08-03 MED ORDER — PREDNISONE 5 MG PO TABS: 5.0000 mg | ORAL_TABLET | Freq: Every day | ORAL | 3 refills | Status: AC

## 2022-08-03 NOTE — Telephone Encounter (Signed)
Referral sent to [facility], phone # [phone].

## 2022-08-03 NOTE — Patient Instructions (Addendum)
I will send in 10 mg tablets: For 5 days take 1/2 of the 50 mg pill For 5 days take 20 mg (4 pills) For 5 days take 15 mg (3 pills) For 5 days take 10 mg (2 pills) For 5 days take 5 mg (1 pill) Then take 1/2 of the 5 mg pill for 5 days and then stop

## 2022-08-03 NOTE — Progress Notes (Signed)
[facility]  PATIENT: Angela Church DOB: Jun 07, 1951  REFERRING DOCTOR OR PCP: Shanon Rosser, PA-C; Donald Prose MD SOURCE: Patient, note from emergency room  _________________________________   HISTORICAL  CHIEF COMPLAINT:  Chief Complaint  Patient presents with   Room 10    Pt is here with her Daughter. Pt states that symptoms started a couple of weeks ago. Pt states that she has some blurry vision.     HISTORY OF PRESENT ILLNESS:  I had the pleasure seeing your patient, Angela Church, at [facility] for neurologic consultation regarding her blurry vision.  She is a 72 year old woman who began to note visual changes about 4 weeks ago.    She had had cataract surgery November 2023 and December 2023.   Her vision initially cleared up but then worsened.    She also had photosensitivity.  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  MRI of the brain was performed showing chronic microvascular ischemic change but no acute findings.  MRI of the orbits was unremarkable.  The optic nerves were normal.    She had a lumbar puncture performed on 07/27/2022.  Opening pressure was normal at 14 cm.  The IgG index was 0.7 (high normal).  CSF was essentially normal.   Around the sam time she also noted some vertigo  In the hospital she had labs and the SSA was elevated.   She does not dry eyes and dry mouth.      She saw her ophthalmologist yesterday and is much better.   The vision and papilledema have improved.  Currently, vision is much better almost back to baseline.     She sometimes hears a hsissing sound in the right ear.     She had no trouble with eye movements.  She notes no difficulty with strength sensation or balance.  She was placed on Synthroid since last year.  TSH has been mildly elevated.  She did have  positive TPO and TG antibodies.  I am uncertain if this has any connection.  She does not have proptosis and on MRI the eye muscles look to be normal in size.  Therefore, she did not appear to have Graves' disease.  She had a temporal artery biopsy 07/28/2022.Marland Kitchen  The biopsy was overall unremarkable.  Specifically was negative for granulomatous arteritis.  Laboratory 07/26/2022 through 07/28/2022: NMO IgG was negative.  ANA was positive.  SSA was elevated (greater than 8) other aspects of the ENA was negative.  Thyroid peroxidase antibody and thyroglobulin antibody was elevated.  ESR was elevated at 103 but CRP was normal..  RPR, antiphospholipid syndrome panel, rheumatoid factor and CCP were negative/normal.   TSH was mildly increased.  Free T4 was normal.  CSF 07/27/2022: Showed normal cells.  Protein and glucose were normal.  HSV 1/2 was negative.  Gram stain and culture was negative.  Rapid viral and bacterial panel was negative for multiple infectious agents.  Oligoclonal bands are pending at the time of this evaluation.   Imaging: I personally reviewed the MRI of the brain and orbits performed 07/26/2022. REVIEW OF SYSTEMS: Constitutional: No fevers, chills, sweats, or change in appetite Eyes: No visual changes, double vision, eye pain Ear, nose and throat: No hearing loss, ear pain, nasal congestion, sore throat Cardiovascular: No chest pain, palpitations Respiratory:  No shortness of breath at rest or with  exertion.   No wheezes GastrointestinaI: No nausea, vomiting, diarrhea, abdominal pain, fecal incontinence Genitourinary:  No dysuria, urinary retention or frequency.  No nocturia. Musculoskeletal:  No neck pain, back pain Integumentary: No rash, pruritus, skin lesions Neurological: as above Psychiatric: No depression at this time.  No anxiety Endocrine: No palpitations, diaphoresis, change in appetite, change in weigh or increased thirst Hematologic/Lymphatic:  No anemia, purpura,  petechiae. Allergic/Immunologic: No itchy/runny eyes, nasal congestion, recent allergic reactions, rashes  ALLERGIES: Allergies  Allergen Reactions   Acetaminophen-Codeine Other (See Comments)    "Felt out of control"    HOME MEDICATIONS:  Current Outpatient Medications:    Coenzyme Q10 (CO Q10 PO), Take 1 capsule by mouth daily., Disp: , Rfl:    COLLAGEN PO, Take 1 capsule by mouth daily., Disp: , Rfl:    furosemide (LASIX) 20 MG tablet, Take 20 mg by mouth daily., Disp: , Rfl:    KRILL OIL PO, Take 1 capsule by mouth daily., Disp: , Rfl:    levothyroxine (SYNTHROID) 50 MCG tablet, Take 50 mcg by mouth daily before breakfast., Disp: , Rfl:    lisinopril (ZESTRIL) 10 MG tablet, Take 1 tablet (10 mg total) by mouth daily. (Patient taking differently: Take 20 mg by mouth daily.), Disp: 90 tablet, Rfl: 1   Multiple Vitamins-Minerals (ONE-A-DAY WOMENS 50 PLUS PO), Take 1 tablet by mouth daily., Disp: , Rfl:    predniSONE (DELTASONE) 5 MG tablet, Take 1 tablet (5 mg total) by mouth daily with breakfast., Disp: 60 tablet, Rfl: 3   rosuvastatin (CRESTOR) 10 MG tablet, Take 1 tablet (10 mg total) by mouth daily. (Patient taking differently: Take 20 mg by mouth daily.), Disp: 90 tablet, Rfl: 1   TURMERIC PO, Take 1 tablet by mouth daily., Disp: , Rfl:   PAST MEDICAL HISTORY: Past Medical History:  Diagnosis Date   Hyperlipidemia    Hypertension     PAST SURGICAL HISTORY: Past Surgical History:  Procedure Laterality Date   APPENDECTOMY     ARTERY BIOPSY Left 07/28/2022   Procedure: BIOPSY TEMPORAL ARTERY;  Surgeon: Cherre Robins, MD;  Location: [facility] OR;  Service: Vascular;  Laterality: Left;   cataract Bilateral    CESAREAN SECTION     CHOLECYSTECTOMY     FINGER SURGERY      FAMILY HISTORY: Family History  Problem Relation Age of Onset   Cancer Mother    Hyperlipidemia Mother    Hypertension Mother    Hyperlipidemia Father    Hypertension Father    Dementia Sister 75     SOCIAL HISTORY: Social History   Socioeconomic History   Marital status: Single    Spouse name: Not on file   Number of children: Not on file   Years of education: Not on file   Highest education level: Not on file  Occupational History   Not on file  Tobacco Use   Smoking status: Former    Years: 15.00    Types: Cigarettes   Smokeless tobacco: Never   Tobacco comments:    apx quit year 2010  Vaping Use   Vaping Use: Never used  Substance and Sexual Activity   Alcohol use: Yes    Comment: occ   Drug use: Never   Sexual activity: Not Currently  Other Topics Concern   Not on file  Social History Narrative   Not on file   Social Determinants of Health   Financial Resource Strain: Not on file  Food Insecurity: No Food Insecurity (  07/27/2022)   Hunger Vital Sign    Worried About Running Out of Food in the Last Year: Never true    White Signal in the Last Year: Never true  Transportation Needs: No Transportation Needs (07/27/2022)   PRAPARE - Hydrologist (Medical): No    Lack of Transportation (Non-Medical): No  Physical Activity: Not on file  Stress: Not on file  Social Connections: Not on file  Intimate Partner Violence: Not At Risk (07/27/2022)   Humiliation, Afraid, Rape, and Kick questionnaire    Fear of Current or Ex-Partner: No    Emotionally Abused: No    Physically Abused: No    Sexually Abused: No       PHYSICAL EXAM  Vitals:   08/03/22 1034  BP: (!) 168/88  Pulse: 74  SpO2: (!) 82%  Weight: 168 lb 8 oz (76.4 kg)  Height: 5' 3"$  (1.6 m)    Body mass index is 29.85 kg/m. The pulse ox was repeated on 2 different fingers and was 96%.  Therefore I believe the 82% was a false reading.   General: The patient is well-developed and well-nourished and in no acute distress  HEENT:  Head is [place].  She has a bandage over the temporal artery biopsy site on the left..  Sclera are anicteric.  Funduscopic exam was difficult due  to small pupils but I did not appreciate any papilledema. \ Neck: No carotid bruits are noted.  The neck is nontender.  Cardiovascular: The heart has a regular rate and rhythm with a normal S1 and S2. There were no murmurs, gallops or rubs.    Skin: Extremities are without rash or  edema.  Musculoskeletal:  Back is nontender  Neurologic Exam  Mental status: The patient is alert and oriented x 3 at the time of the examination. The patient has apparent normal recent and remote memory, with an apparently normal attention span and concentration ability.   Speech is normal.  Cranial nerves: Extraocular movements are full. Pupils are equal, round, and reactive to light and accomodation.  Color vision was normal.  Facial symmetry is present. There is good facial sensation to soft touch bilaterally.Facial strength is normal.  Trapezius and sternocleidomastoid strength is normal. No dysarthria is noted.  The tongue is midline, and the patient has symmetric elevation of the soft palate. No obvious hearing deficits are noted.  Motor:  Muscle bulk is normal.   Tone is normal. Strength is  5 / 5 in all 4 extremities.   Sensory: Sensory testing is intact to pinprick, soft touch and vibration sensation in all 4 extremities.  Coordination: Cerebellar testing reveals good finger-nose-finger and heel-to-shin bilaterally.  Gait and station: Station is normal.   Gait is normal. Tandem gait is mildly wide, but probably normal for age.. Romberg is negative.   Reflexes: Deep tendon reflexes are symmetric and normal bilaterally.   Plantar responses are flexor.    DIAGNOSTIC DATA (LABS, IMAGING, TESTING) - I reviewed patient records, labs, notes, testing and imaging myself where available.  Lab Results  Component Value Date   WBC 5.5 07/26/2022   HGB 12.0 07/26/2022   HCT 35.5 (L) 07/26/2022   MCV 93.4 07/26/2022   PLT 297 07/26/2022      Component Value Date/Time   NA 133 (L) 07/26/2022 1535   NA  138 04/14/2020 1452   K 4.7 07/26/2022 1535   CL 100 07/26/2022 1535   CO2 26 07/26/2022 1535  GLUCOSE 90 07/26/2022 1535   BUN 25 (H) 07/26/2022 1535   BUN 14 04/14/2020 1452   CREATININE 0.87 07/26/2022 1535   CALCIUM 8.4 (L) 07/26/2022 1535   PROT 5.6 (L) 07/26/2022 1535   PROT 5.8 (L) 04/14/2020 1452   ALBUMIN 2.2 (L) 07/27/2022 1353   AST 25 07/26/2022 1535   ALT 20 07/26/2022 1535   ALKPHOS 45 07/26/2022 1535   BILITOT 0.4 07/26/2022 1535   BILITOT 0.3 04/14/2020 1452   GFRNONAA >60 07/26/2022 1535   GFRAA 107 04/14/2020 1452     No results found for: "VITAMINB12" Lab Results  Component Value Date   TSH 5.090 (H) 07/27/2022       ASSESSMENT AND PLAN  Dry eyes - Plan: Ambulatory referral to Rheumatology  Elevated antinuclear antibody (ANA) level - Plan: Ambulatory referral to Rheumatology  Papilledema - Plan: Angiotensin converting enzyme  In summary, Ms. Bragan is a 72 year old woman who had visual changes and was found to have papilledema.  She was admitted and had multiple testing done.  Lumbar puncture did not show elevated pressure.  Most of the labs have come back from the CSF and were normal.  The oligoclonal bands are still pending.  She had some abnormal serology including positive ANA (likely related to elevated SSA) and elevated TPO and TG antibodies.  The relationship of these abnormalities to her symptoms is uncertain.  The temporal artery biopsy just returned and is negative.  Therefore, I am going to start tapering her steroid dose and try to get her off over the next month.  Although the oligoclonal bands are still pending, I think the likelihood of MS is extremely low given the appearance of the MRI and her age.  She is much better now than she was 2 weeks ago.  Vision is 20/30.  She reports her visual acuity was 20/200.  She no longer notes blurry vision but does not feel it is completely back to baseline.    It is uncertain what caused her symptoms.   Perhaps they were monophasic related to her cataract surgery or postviral inflammatory.  I will check an angiotensin-converting enzyme as sarcoid is a rare cause of papilledema.  She does have dry eyes, dry mouth with an elevated SSA and I will refer her to rheumatology for evaluation of possible Sjogren's syndrome.  She will return to see me if she has new or worsening neurologic symptoms.  Thank you for asking me to see Ms. Gottfried.  Please let me know if I can be of further assistance with her or other patients in the future. Taper steroid. Awai OCB Refer to rheum   Jessee Newnam A. Felecia Shelling, MD, [facility] [id number], [id number] PM Certified in Neurology, Clinical Neurophysiology, Sleep Medicine and Neuroimaging  [facility] [address], [place],  [address] [phone]

## 2022-08-03 NOTE — Telephone Encounter (Signed)
Called [facility] at [phone]. Chose option 5 for operator who transferred me to Lab. I spoke w/ Celesta Gentile. He confirmed Oligoclonal bands, CSF + serm lab should be resulted by now that was drawn on 07/27/22. He placed me on hold to further investigate.  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 Did not have expected turn around time. Transferred me to his team lead. States turn around time is 6 business days for oligoclonal bands and 3-5 business days for Neuromyelitis CSF lab (went to lab in [place]). Hopefully should be back soon.

## 2022-08-04 LAB — OLIGOCLONAL BANDS, CSF + SERM

## 2022-08-04 LAB — ANGIOTENSIN CONVERTING ENZYME: Angio Convert Enzyme: 7 U/L — ABNORMAL LOW (ref 14–82)

## 2022-08-12 LAB — MISC LABCORP TEST (SEND OUT): Labcorp test code: 9985

## 2022-08-26 LAB — FUNGUS CULTURE WITH STAIN

## 2022-08-26 LAB — FUNGAL ORGANISM REFLEX

## 2022-08-26 LAB — FUNGUS CULTURE RESULT

## 2022-09-23 ENCOUNTER — Telehealth: Payer: Self-pay | Admitting: Neurology

## 2022-09-23 NOTE — Telephone Encounter (Signed)
Patient saw her Dr. Drenda Freeze, rheumatology for positive ANA (SSA positive) and dry eyes and joint pain.  Additional lab work was performed for possible Sjogren's syndrome.

## 2022-11-22 ENCOUNTER — Other Ambulatory Visit: Payer: Self-pay | Admitting: Nephrology

## 2022-11-22 DIAGNOSIS — R809 Proteinuria, unspecified: Secondary | ICD-10-CM

## 2022-11-22 DIAGNOSIS — I129 Hypertensive chronic kidney disease with stage 1 through stage 4 chronic kidney disease, or unspecified chronic kidney disease: Secondary | ICD-10-CM

## 2022-11-22 DIAGNOSIS — N181 Chronic kidney disease, stage 1: Secondary | ICD-10-CM

## 2023-01-11 ENCOUNTER — Ambulatory Visit
Admission: RE | Admit: 2023-01-11 | Discharge: 2023-01-11 | Disposition: A | Discharge status: Home / Self Care | Payer: Federal, State, Local not specified - PPO | Source: Ambulatory Visit | Attending: Nephrology | Admitting: Nephrology

## 2023-01-11 DIAGNOSIS — N181 Chronic kidney disease, stage 1: Secondary | ICD-10-CM

## 2023-01-11 DIAGNOSIS — R809 Proteinuria, unspecified: Secondary | ICD-10-CM

## 2023-01-11 DIAGNOSIS — I129 Hypertensive chronic kidney disease with stage 1 through stage 4 chronic kidney disease, or unspecified chronic kidney disease: Secondary | ICD-10-CM
